# Patient Record
Sex: Male | Born: 1980 | Hispanic: Yes | Marital: Married | Smoking: Never smoker
Health system: Southern US, Community
[De-identification: ages and names within clinical notes are randomized; demographics above are authoritative.]

## PROBLEM LIST (undated history)

## (undated) DIAGNOSIS — N200 Calculus of kidney: Secondary | ICD-10-CM

---

## 2001-10-18 ENCOUNTER — Emergency Department (HOSPITAL_COMMUNITY): Admission: EM | Admit: 2001-10-18 | Discharge: 2001-10-18 | Payer: Self-pay

## 2012-04-03 ENCOUNTER — Encounter: Payer: Self-pay | Admitting: *Deleted

## 2013-12-10 ENCOUNTER — Ambulatory Visit (INDEPENDENT_AMBULATORY_CARE_PROVIDER_SITE_OTHER): Payer: BC Managed Care – PPO | Admitting: Physician Assistant

## 2013-12-10 VITALS — BP 110/80 | HR 92 | Temp 99.5°F | Resp 18 | Ht 65.5 in | Wt 150.0 lb

## 2013-12-10 DIAGNOSIS — J111 Influenza due to unidentified influenza virus with other respiratory manifestations: Secondary | ICD-10-CM

## 2013-12-10 DIAGNOSIS — J029 Acute pharyngitis, unspecified: Secondary | ICD-10-CM

## 2013-12-10 DIAGNOSIS — R509 Fever, unspecified: Secondary | ICD-10-CM

## 2013-12-10 DIAGNOSIS — R05 Cough: Secondary | ICD-10-CM

## 2013-12-10 DIAGNOSIS — J101 Influenza due to other identified influenza virus with other respiratory manifestations: Secondary | ICD-10-CM

## 2013-12-10 LAB — POCT CBC
Hemoglobin: 13.7 g/dL — AB (ref 14.1–18.1)
Lymph, poc: 1.3 (ref 0.6–3.4)
MCH, POC: 30 pg (ref 27–31.2)
MCHC: 32.2 g/dL (ref 31.8–35.4)
MID (cbc): 0.6 (ref 0–0.9)
MPV: 8 fL (ref 0–99.8)
POC Granulocyte: 4.1 (ref 2–6.9)
POC LYMPH PERCENT: 22.4 %L (ref 10–50)
POC MID %: 9.8 %M (ref 0–12)
Platelet Count, POC: 148 10*3/uL (ref 142–424)
RDW, POC: 12.8 %
WBC: 6 10*3/uL (ref 4.6–10.2)

## 2013-12-10 LAB — POCT INFLUENZA A/B: Influenza A, POC: POSITIVE

## 2013-12-10 MED ORDER — HYDROCODONE-HOMATROPINE 5-1.5 MG/5ML PO SYRP: ORAL_SOLUTION | ORAL | Status: DC

## 2013-12-10 MED ORDER — OSELTAMIVIR PHOSPHATE 75 MG PO CAPS: 75.0000 mg | ORAL_CAPSULE | Freq: Two times a day (BID) | ORAL | Status: DC

## 2013-12-10 NOTE — Progress Notes (Signed)
Patient ID: Jerry Booker MRN: [id number], DOB: 07-02-1981, 32 y.o. Date of Encounter: 12/10/2013, 5:20 PM  Primary Physician: No primary provider on file.  Chief Complaint: ST, congestion, cough, fever, chills, and myalgias x 4 days  HPI: 32 y.o. male with history below presents with 4 day history of ST, congestion, cough, fever, chills, and myalgias. T max 102 earlier this week. Symptoms began with a ST then progressed to the above. Cough has become productive over the past 24 hours. Cough is not worse any time of the day. No SOB or wheezing. He is sore from coughing. He has been taking ibuprofen 800 mg and Tylenol 1000 mg alternating around the clock to help with his aches. He did not receive an influenza vaccine this year. His wife has been sick with similar symptoms this past week as well.    Past Medical History  Diagnosis Date  . Chest pain      Home Meds: Prior to Admission medications   Medication Sig Start Date End Date Taking? Authorizing Provider  Multiple Vitamin (MULTIVITAMIN) tablet Take 1 tablet by mouth daily.   Yes Historical Provider, MD    Allergies: No Known Allergies  History   Social History  . Marital Status: Married    Spouse Name: N/A    Number of Children: N/A  . Years of Education: N/A   Occupational History  . Not on file.   Social History Main Topics  . Smoking status: Never Smoker   . Smokeless tobacco: Never Used  . Alcohol Use: Yes     Comment: rarely  . Drug Use: No  . Sexual Activity:    Other Topics Concern  . Not on file   Social History Narrative  . No narrative on file     Review of Systems: Constitutional: positive for chills, fever, fatigue, and myalgias  HEENT: see above Cardiovascular: negative for chest pain or palpitations Respiratory: positive for cough. negative for wheezing, or shortness of breath Abdominal: negative for abdominal pain, nausea, vomiting, or diarrhea Dermatological: negative for  rash Neurologic: positive for headache. negative for dizziness, or syncope   Physical Exam: Blood pressure 110/80, pulse 92, temperature 99.5 F (37.5 C), temperature source Oral, resp. rate 18, height 5' 5.5" (1.664 m), weight 150 lb (68.04 kg), SpO2 99.00%., Body mass index is 24.57 kg/(m^2). General: Well developed, well nourished, in no acute distress. Head: Normocephalic, atraumatic, eyes without discharge, sclera non-icteric, nares are congested. Bilateral auditory canals clear, TM's are without perforation, pearly grey and translucent with reflective cone of light bilaterally. Oral cavity moist, posterior pharynx erythematous with post nasal drip. No exudate or peritonsillar abscess. Uvula midline. Neck: Supple. No thyromegaly. Full ROM. Lymph nodes: less than 2 cm AC bilaterally. Lungs: Clear bilaterally to auscultation without wheezes, rales, or rhonchi. Breathing is unlabored. Heart: RRR with S1 S2. No murmurs, rubs, or gallops appreciated. Msk:  Strength and tone normal for age. Extremities/Skin: Warm and dry. No clubbing or cyanosis. No edema. No rashes or suspicious lesions. Neuro: Alert and oriented X 3. Moves all extremities spontaneously. Gait is normal. CNII-XII grossly in tact. Psych:  Responds to questions appropriately with a normal affect.   Labs: Results for orders placed in visit on 12/10/13  POCT CBC      Result Value Range   WBC 6.0  4.6 - 10.2 K/uL   Lymph, poc 1.3  0.6 - 3.4   POC LYMPH PERCENT 22.4  10 - 50 %L   MID (cbc) 0.6  0 - 0.9   POC MID % 9.8  0 - 12 %M   POC Granulocyte 4.1  2 - 6.9   Granulocyte percent 67.8  37 - 80 %G   RBC 4.56 (*) 4.69 - 6.13 M/uL   Hemoglobin 13.7 (*) 14.1 - 18.1 g/dL   HCT, POC 16.1 (*) 09.6 - 53.7 %   MCV 93.3  80 - 97 fL   MCH, POC 30.0  27 - 31.2 pg   MCHC 32.2  31.8 - 35.4 g/dL   RDW, POC 04.5     Platelet Count, POC 148  142 - 424 K/uL   MPV 8.0  0 - 99.8 fL  POCT INFLUENZA A/B      Result Value Range   Influenza  A, POC Positive     Influenza B, POC Negative    POCT RAPID STREP A (OFFICE)      Result Value Range   Rapid Strep A Screen Negative  Negative    Throat culture pending  ASSESSMENT AND PLAN:  32 y.o. male with influenza A, cough, fever, and myalgias  -Tamiflu 75 mg 1 po bid #10 no RF -Hycodan #4oz 1 tsp po q 4-6 hours prn cough no RF SED -Ibuprofen prn -Out of work until without symptoms for 48 hours, per CDC guidelines  -Rest/fluids -RTC precautions  Signed, Eula Listen, PA-C [facility] [place], [place] [address] [phone] 12/10/2013 5:20 PM

## 2013-12-12 LAB — CULTURE, GROUP A STREP: Organism ID, Bacteria: NORMAL

## 2014-07-13 ENCOUNTER — Ambulatory Visit (INDEPENDENT_AMBULATORY_CARE_PROVIDER_SITE_OTHER): Payer: BC Managed Care – PPO | Admitting: Physician Assistant

## 2014-07-13 VITALS — BP 108/78 | HR 68 | Temp 98.2°F | Resp 16 | Ht 65.5 in | Wt 152.6 lb

## 2014-07-13 DIAGNOSIS — L255 Unspecified contact dermatitis due to plants, except food: Secondary | ICD-10-CM

## 2014-07-13 DIAGNOSIS — L237 Allergic contact dermatitis due to plants, except food: Secondary | ICD-10-CM

## 2014-07-13 DIAGNOSIS — L282 Other prurigo: Secondary | ICD-10-CM

## 2014-07-13 MED ORDER — METHYLPREDNISOLONE SODIUM SUCC 125 MG IJ SOLR
125.0000 mg | Freq: Once | INTRAMUSCULAR | Status: AC
  Administered 2014-07-13: 125 mg via INTRAMUSCULAR

## 2014-07-13 MED ORDER — CLOBETASOL PROPIONATE 0.05 % EX CREA: 1.0000 "application " | TOPICAL_CREAM | Freq: Two times a day (BID) | CUTANEOUS | Status: DC

## 2014-07-13 MED ORDER — PREDNISONE 20 MG PO TABS: ORAL_TABLET | ORAL | Status: DC

## 2014-07-13 NOTE — Patient Instructions (Signed)
Start prednisone taper tomorrow morning.  May use clobetasol cream 2-3 times daily to affected areas on arms/legs/trunk Benadryl 25-50 mg at bedtime to help with itching Zyrtec (cetirizine) daily in the morning     Poison [facility] Poison ivy is a inflammation of the skin (contact dermatitis) caused by touching the allergens on the leaves of the ivy plant following previous exposure to the plant. The rash usually appears 48 hours after exposure. The rash is usually bumps (papules) or blisters (vesicles) in a linear pattern. Depending on your own sensitivity, the rash may simply cause redness and itching, or it may also progress to blisters which may break open. These must be well cared for to prevent secondary bacterial (germ) infection, followed by scarring. Keep any open areas dry, clean, dressed, and covered with an antibacterial ointment if needed. The eyes may also get puffy. The puffiness is worst in the morning and gets better as the day progresses. This dermatitis usually heals without scarring, within 2 to 3 weeks without treatment. HOME CARE INSTRUCTIONS  Thoroughly wash with soap and water as soon as you have been exposed to poison ivy. You have about one half hour to remove the plant resin before it will cause the rash. This washing will destroy the oil or antigen on the skin that is causing, or will cause, the rash. Be sure to wash under your fingernails as any plant resin there will continue to spread the rash. Do not rub skin vigorously when washing affected area. Poison ivy cannot spread if no oil from the plant remains on your body. A rash that has progressed to weeping sores will not spread the rash unless you have not washed thoroughly. It is also important to wash any clothes you have been wearing as these may carry active allergens. The rash will return if you wear the unwashed clothing, even several days later. Avoidance of the plant in the future is the best measure. Poison ivy plant can  be recognized by the number of leaves. Generally, poison ivy has three leaves with flowering branches on a single stem. Diphenhydramine may be purchased over the counter and used as needed for itching. Do not drive with this medication if it makes you drowsy.Ask your caregiver about medication for children. SEEK MEDICAL CARE IF:  Open sores develop.  Redness spreads beyond area of rash.  You notice purulent (pus-like) discharge.  You have increased pain.  Other signs of infection develop (such as fever). Document Released: 12/06/2000 Document Revised: 03/02/2012 Document Reviewed: 10/25/2009 [facility] Patient Information 2015 [place], [place]. This information is not intended to replace advice given to you by your health care provider. Make sure you discuss any questions you have with your health care provider.

## 2014-07-13 NOTE — Progress Notes (Signed)
   Subjective:    Patient ID: Jerry Booker, male    DOB: 11/15/1981, 33 y.o.   MRN: [id number]  HPI 33 year old male presents for evaluation of poison ivy. States he was working outside pulling weeds on 7/15 and was exposed. Symptoms did not start until 7/17. He has subsequently developed a pruritic rash on both arms, legs, trunk, and his face. It does seem to be spreading and is intensely pruritic. No OTC treatments tried. He is worried because it now is involving his face and eyelids.  Patient is otherwise doing well with no other concerns today.     Review of Systems  Constitutional: Negative for fever and chills.  Eyes: Negative for pain and visual disturbance.  Respiratory: Negative for shortness of breath.   Gastrointestinal: Negative for nausea and vomiting.  Skin: Positive for rash.       Objective:   Physical Exam  Constitutional: He is oriented to person, place, and time. He appears well-developed and well-nourished.  HENT:  Head: Normocephalic and atraumatic.  Right Ear: External ear normal.  Eyes: Conjunctivae are normal.  Bilateral eyelids slightly swollen and erythematous. Cheeks and nose have erythematous rash  Neck: Normal range of motion.  Cardiovascular: Normal rate, regular rhythm and normal heart sounds.   Pulmonary/Chest: Effort normal.  Neurological: He is alert and oriented to person, place, and time.  Skin:     Noted areas have a papulovesiclar rash in a linear distribution. Right arm +weeping serous fluid. No purulence, warmth, or induration.   Psychiatric: He has a normal mood and affect. His behavior is normal. Judgment and thought content normal.          Assessment & Plan:  Poison ivy - Plan: methylPREDNISolone sodium succinate (SOLU-MEDROL) 125 mg/2 mL injection 125 mg, predniSONE (DELTASONE) 20 MG tablet, clobetasol cream (TEMOVATE) 0.05 %  Pruritic rash - Plan: methylPREDNISolone sodium succinate (SOLU-MEDROL) 125 mg/2 mL injection 125 mg,  predniSONE (DELTASONE) 20 MG tablet  Due to facial involvement, will treat aggressively with Solumedrol 125 mg IM today Start prednisone taper tomorrow Clobetasol cream 2-3 times daily to arms/legs/trunk Recommend benadryl 25-50 mg at bedtime, zyrtec daily in the morning RTC precautions discussed. F/u if symptoms worsening or fail to improve.

## 2014-10-19 ENCOUNTER — Ambulatory Visit (INDEPENDENT_AMBULATORY_CARE_PROVIDER_SITE_OTHER): Payer: BC Managed Care – PPO | Admitting: Family Medicine

## 2014-10-19 VITALS — BP 104/70 | HR 73 | Temp 98.3°F | Resp 16 | Ht 65.75 in | Wt 149.0 lb

## 2014-10-19 DIAGNOSIS — Z23 Encounter for immunization: Secondary | ICD-10-CM

## 2014-10-19 DIAGNOSIS — Z131 Encounter for screening for diabetes mellitus: Secondary | ICD-10-CM

## 2014-10-19 DIAGNOSIS — M25562 Pain in left knee: Secondary | ICD-10-CM

## 2014-10-19 DIAGNOSIS — J358 Other chronic diseases of tonsils and adenoids: Secondary | ICD-10-CM

## 2014-10-19 DIAGNOSIS — Z13220 Encounter for screening for lipoid disorders: Secondary | ICD-10-CM

## 2014-10-19 DIAGNOSIS — J029 Acute pharyngitis, unspecified: Secondary | ICD-10-CM

## 2014-10-19 DIAGNOSIS — Z0000 Encounter for general adult medical examination without abnormal findings: Secondary | ICD-10-CM

## 2014-10-19 NOTE — Patient Instructions (Addendum)
You can try over the counter Claritin and Zantac to treat allergies and possible heartburn cause of sore throat. If neither one of these re helping over the next few weeks, let me know and I will refer you to an Ear, Nose and Throat specialist. I do not see anything of concern on your throat exam today.  For your knee pain, try some of the exercises below, Tylenol as needed and if not improving return to discuss further and possible X-rays.  Return to the clinic or go to the nearest emergency room if any of your symptoms worsen or new symptoms occur.  You should receive a call or letter about your lab results within the next week to 10 days.     Keeping you healthy  Get these tests  Blood pressure- Have your blood pressure checked once a year by your healthcare provider.  Normal blood pressure is 120/80.  Weight- Have your body mass index (BMI) calculated to screen for obesity.  BMI is a measure of body fat based on height and weight. You can also calculate your own BMI at https://www.west-esparza.com/www.nhlbisupport.com/bmi/.  Cholesterol- Have your cholesterol checked regularly starting at age 34, sooner may be necessary if you have diabetes, high blood pressure, if a family member developed heart diseases at an early age or if you smoke.   Chlamydia, HIV, and other sexual transmitted disease- Get screened each year until the age of 34 then within three months of each new sexual partner.  Diabetes- Have your blood sugar checked regularly if you have high blood pressure, high cholesterol, a family history of diabetes or if you are overweight.  Get these vaccines  Flu shot- Every fall.  Tetanus shot- Every 10 years.  Menactra- Single dose; prevents meningitis.  Take these steps  Don't smoke- If you do smoke, ask your healthcare provider about quitting. For tips on how to quit, go to www.smokefree.gov or call 1-800-QUIT-NOW.  Be physically active- Exercise 5 days a week for at least 30 minutes.  If you are  not already physically active start slow and gradually work up to 30 minutes of moderate physical activity.  Examples of moderate activity include walking briskly, mowing the yard, dancing, swimming bicycling, etc.  Eat a healthy diet- Eat a variety of healthy foods such as fruits, vegetables, low fat milk, low fat cheese, yogurt, lean meats, poultry, fish, beans, tofu, etc.  For more information on healthy eating, go to www.thenutritionsource.org  Drink alcohol in moderation- Limit alcohol intake two drinks or less a day.  Never drink and drive.  Dentist- Brush and floss teeth twice daily; visit your dentis twice a year.  Depression-Your emotional health is as important as your physical health.  If you're feeling down, losing interest in things you normally enjoy please talk with your healthcare provider.  Gun Safety- If you keep a gun in your home, keep it unloaded and with the safety lock on.  Bullets should be stored separately.  Helmet use- Always wear a helmet when riding a motorcycle, bicycle, rollerblading or skateboarding.  Safe sex- If you may be exposed to a sexually transmitted infection, use a condom  Seat belts- Seat bels can save your life; always wear one.  Smoke/Carbon Monoxide detectors- These detectors need to be installed on the appropriate level of your home.  Replace batteries at least once a year.  Skin Cancer- When out in the sun, cover up and use sunscreen SPF 15 or higher.  Violence- If anyone is threatening  or hurting you, please tell your healthcare provider.  Knee Exercises EXERCISES RANGE OF MOTION (ROM) AND STRETCHING EXERCISES These exercises may help you when beginning to rehabilitate your injury. Your symptoms may resolve with or without further involvement from your physician, physical therapist, or athletic trainer. While completing these exercises, remember:   Restoring tissue flexibility helps normal motion to return to the joints. This allows  healthier, less painful movement and activity.  An effective stretch should be held for at least 30 seconds.  A stretch should never be painful. You should only feel a gentle lengthening or release in the stretched tissue. STRETCH - Knee Extension, Prone  Lie on your stomach on a firm surface, such as a bed or countertop. Place your right / left knee and leg just beyond the edge of the surface. You may wish to place a towel under the far end of your right / left thigh for comfort.  Relax your leg muscles and allow gravity to straighten your knee. Your clinician may advise you to add an ankle weight if more resistance is helpful for you.  You should feel a stretch in the back of your right / left knee. Hold this position for __________ seconds. Repeat __________ times. Complete this stretch __________ times per day. * Your physician, physical therapist, or athletic trainer may ask you to add ankle weight to enhance your stretch.  RANGE OF MOTION - Knee Flexion, Active  Lie on your back with both knees straight. (If this causes back discomfort, bend your opposite knee, placing your foot flat on the floor.)  Slowly slide your heel back toward your buttocks until you feel a gentle stretch in the front of your knee or thigh.  Hold for __________ seconds. Slowly slide your heel back to the starting position. Repeat __________ times. Complete this exercise __________ times per day.  STRETCH - Quadriceps, Prone   Lie on your stomach on a firm surface, such as a bed or padded floor.  Bend your right / left knee and grasp your ankle. If you are unable to reach your ankle or pant leg, use a belt around your foot to lengthen your reach.  Gently pull your heel toward your buttocks. Your knee should not slide out to the side. You should feel a stretch in the front of your thigh and/or knee.  Hold this position for __________ seconds. Repeat __________ times. Complete this stretch __________ times  per day.  STRETCH - Hamstrings, Supine   Lie on your back. Loop a belt or towel over the ball of your right / left foot.  Straighten your right / left knee and slowly pull on the belt to raise your leg. Do not allow the right / left knee to bend. Keep your opposite leg flat on the floor.  Raise the leg until you feel a gentle stretch behind your right / left knee or thigh. Hold this position for __________ seconds. Repeat __________ times. Complete this stretch __________ times per day.  STRENGTHENING EXERCISES These exercises may help you when beginning to rehabilitate your injury. They may resolve your symptoms with or without further involvement from your physician, physical therapist, or athletic trainer. While completing these exercises, remember:   Muscles can gain both the endurance and the strength needed for everyday activities through controlled exercises.  Complete these exercises as instructed by your physician, physical therapist, or athletic trainer. Progress the resistance and repetitions only as guided.  You may experience muscle soreness or fatigue,  but the pain or discomfort you are trying to eliminate should never worsen during these exercises. If this pain does worsen, stop and make certain you are following the directions exactly. If the pain is still present after adjustments, discontinue the exercise until you can discuss the trouble with your clinician. STRENGTH - Quadriceps, Isometrics  Lie on your back with your right / left leg extended and your opposite knee bent.  Gradually tense the muscles in the front of your right / left thigh. You should see either your knee cap slide up toward your hip or increased dimpling just above the knee. This motion will push the back of the knee down toward the floor/mat/bed on which you are lying.  Hold the muscle as tight as you can without increasing your pain for __________ seconds.  Relax the muscles slowly and completely in  between each repetition. Repeat __________ times. Complete this exercise __________ times per day.  STRENGTH - Quadriceps, Short Arcs   Lie on your back. Place a __________ inch towel roll under your knee so that the knee slightly bends.  Raise only your lower leg by tightening the muscles in the front of your thigh. Do not allow your thigh to rise.  Hold this position for __________ seconds. Repeat __________ times. Complete this exercise __________ times per day.  OPTIONAL ANKLE WEIGHTS: Begin with ____________________, but DO NOT exceed ____________________. Increase in 1 pound/0.5 kilogram increments.  STRENGTH - Quadriceps, Straight Leg Raises  Quality counts! Watch for signs that the quadriceps muscle is working to insure you are strengthening the correct muscles and not "cheating" by substituting with healthier muscles.  Lay on your back with your right / left leg extended and your opposite knee bent.  Tense the muscles in the front of your right / left thigh. You should see either your knee cap slide up or increased dimpling just above the knee. Your thigh may even quiver.  Tighten these muscles even more and raise your leg 4 to 6 inches off the floor. Hold for __________ seconds.  Keeping these muscles tense, lower your leg.  Relax the muscles slowly and completely in between each repetition. Repeat __________ times. Complete this exercise __________ times per day.  STRENGTH - Hamstring, Curls  Lay on your stomach with your legs extended. (If you lay on a bed, your feet may hang over the edge.)  Tighten the muscles in the back of your thigh to bend your right / left knee up to 90 degrees. Keep your hips flat on the bed/floor.  Hold this position for __________ seconds.  Slowly lower your leg back to the starting position. Repeat __________ times. Complete this exercise __________ times per day.  OPTIONAL ANKLE WEIGHTS: Begin with ____________________, but DO NOT exceed  ____________________. Increase in 1 pound/0.5 kilogram increments.  STRENGTH - Quadriceps, Squats  Stand in a door frame so that your feet and knees are in line with the frame.  Use your hands for balance, not support, on the frame.  Slowly lower your weight, bending at the hips and knees. Keep your lower legs upright so that they are parallel with the door frame. Squat only within the range that does not increase your knee pain. Never let your hips drop below your knees.  Slowly return upright, pushing with your legs, not pulling with your hands. Repeat __________ times. Complete this exercise __________ times per day.  STRENGTH - Quadriceps, Wall Slides  Follow guidelines for form closely. Increased knee pain  often results from poorly placed feet or knees.  Lean against a smooth wall or door and walk your feet out 18-24 inches. Place your feet hip-width apart.  Slowly slide down the wall or door until your knees bend __________ degrees.* Keep your knees over your heels, not your toes, and in line with your hips, not falling to either side.  Hold for __________ seconds. Stand up to rest for __________ seconds in between each repetition. Repeat __________ times. Complete this exercise __________ times per day. * Your physician, physical therapist, or athletic trainer will alter this angle based on your symptoms and progress. Document Released: 10/23/2005 Document Revised: 04/25/2014 Document Reviewed: 03/23/2009 [facility] Patient Information 2015 [place], [place]. This information is not intended to replace advice given to you by your health care provider. Make sure you discuss any questions you have with your health care provider.

## 2014-10-19 NOTE — Progress Notes (Addendum)
Subjective:   Patient ID: Jerry Booker, male    DOB: 11/14/1981, 33 y.o.   MRN: [id number] This chart was scribed for Jerry StaggersJeffrey Ural Acree, MD by Jerry Booker, ED Scribe. The patient was seen in Room 4 The patient's care was started at 2:41 PM.   10/19/2014  Chief Complaint  Patient presents with  . Annual Exam    HPI HPI Comments: Jerry Booker is a 33 y.o. male who presents to the [facility] here for complete physical exam. Pt notes he does not have any specific health concerns today but he would like to test his blood sugar and cholesterol. He is fasting today for his blood work. Pt also notes he has a white bump on his right tonsil with occasional pain and pain with swallowing. Pt notes the bump stinks. He notes he has been pushing the bump with a Q-tip with no relief. Pt denies rhinorrhea, abdominal pain or cough at this time.   Pt also complains of constant, gradually worsening, mild left knee pain onset two weeks ago. 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 Pt denies any prior surgery to the left knee. Pt denies any mechanical symptoms. Pt denies taking medications to relieve his pain.   Health Maintenance 1.) Immunizations:   A. Tetanus: Pt denies his last tetanus shot was greater than 10 years ago, but is unable to specify the exact date. He believes it has been about 8-9 years.   B. Flu Shot: Pt denies recently receiving  the flu shot. Pt had the flu last year and was sick for about two weeks. Pt would like to receive the flu vaccine today.  2.) Exercise: Pt notes he typically exercises 2x/week for about 20 minutes.  3.) Dentist: Pt notes he went to the dentist within the last 6 months and goes annually. 4.) Eye Care Provider: Pt denies seeing an ophthalmologist regularly but does not he saw an ophthalmologist with normal findings last year due to extensive sun exposure.   5.) STI Testing: Pt is  married with no partners outside the marriage. Pt has previously had STI testing prior to getting married including HIV.  Pt denies alcohol usage and denies smoking.    There are no active problems to display for this patient.  Past Medical History  Diagnosis Date  . Chest pain    History reviewed. No pertinent past surgical history. No Known Allergies Prior to Admission medications   Medication Sig Start Date End Date Taking? Authorizing Provider  clobetasol cream (TEMOVATE) 0.05 % Apply 1 application topically 2 (two) times daily. 07/13/14   Jerry Jaquita RectorM Marte, PA-C  diphenhydrAMINE (BENADRYL) 25 MG tablet Take 25 mg by mouth once. Pt took 2 pills last night 07/12/14 07/13/14  Historical Provider, MD  predniSONE (DELTASONE) 20 MG tablet Take 3 PO QAM x3days, 2 PO QAM x3days, 1 PO QAM x3days 07/13/14   Jerry NayHeather M Marte, PA-C   History   Social History  . Marital Status: Married    Spouse Name: N/A    Number of Children: N/A  . Years of Education: N/A   Occupational History  . Not on file.   Social History Main Topics  . Smoking status: Never Smoker   . Smokeless tobacco: Never Used  . Alcohol Use: Yes     Comment: rarely  . Drug Use: No  . Sexual Activity:    Other Topics Concern  .  Not on file   Social History Narrative  . No narrative on file    Review of Systems  Constitutional: Negative for fatigue and unexpected weight change.  HENT: Positive for sore throat. Negative for rhinorrhea.   Eyes: Negative for visual disturbance.  Respiratory: Negative for cough, chest tightness and shortness of breath.   Cardiovascular: Negative for chest pain, palpitations and leg swelling.  Gastrointestinal: Negative for abdominal pain and blood in stool.  Musculoskeletal: Positive for arthralgias.  Neurological: Negative for dizziness, light-headedness and headaches.  All other systems reviewed and are negative.   Objective:  Physical Exam  Nursing note and vitals  reviewed. Constitutional: He is oriented to person, place, and time. He appears well-developed and well-nourished.  HENT:  Head: Normocephalic and atraumatic.  Right Ear: External ear normal.  Left Ear: External ear normal.  Mouth/Throat: Oropharynx is clear and moist.  Posterior oropharynx at the apex of his right tonsillar recess. Very small, white adherent tonsolith, no exudate or hypertrophy.  Eyes: Conjunctivae and EOM are normal. Pupils are equal, round, and reactive to light.  Neck: Normal range of motion. Neck supple. No JVD present. Carotid bruit is not present. No thyromegaly present.  No lymphadenopathy.   Cardiovascular: Normal rate, regular rhythm, normal heart sounds and intact distal pulses.   No murmur heard. Pulmonary/Chest: Effort normal and breath sounds normal. No respiratory distress. He has no wheezes. He has no rales.  Abdominal: Soft. He exhibits no distension. There is no tenderness. Hernia confirmed negative in the right inguinal area and confirmed negative in the left inguinal area.  Musculoskeletal: Normal range of motion. He exhibits no edema and no tenderness.  Right knee: FROM with no effusion. Left knee: FROM, no effusion, skin intact. Negative for McMurray's, Lachman's, Valgus and Varus tests. Negative anterior and posterior drawer.    Lymphadenopathy:    He has no cervical adenopathy.  Neurological: He is alert and oriented to person, place, and time. He has normal reflexes.  Skin: Skin is warm and dry.  Psychiatric: He has a normal mood and affect. His behavior is normal.   Filed Vitals:   10/19/14 1357  BP: 104/70  Pulse: 73  Temp: 98.3 F (36.8 C)  TempSrc: Oral  Resp: 16  Height: 5' 5.75" (1.67 m)  Weight: 149 lb (67.586 kg)  SpO2: 97%   Assessment & Plan:  303 PM- Patient informed of current plan for treatment and evaluation and agrees with plan at this time. Jerry Booker is a 33 y.o. male Annual physical exam  --anticipatory guidance  as below in AVS, screening labs above. Health maintenance items as above in HPI discussed/recommended as applicable.   Screening cholesterol level - Plan: Comprehensive metabolic panel  Diabetes mellitus screening - Plan: Lipid panel  Flu vaccine need - flu vaccine given.   Left knee pain - intermittent, but longstanding by hx, NKI. Reassuring exam. Discussed XR - declined today. Trial of HEP, otc tylenol if needed - rtc for further eval if persists.   Tonsillith, Sore throat  -suspect the white substance he has seen on tonsil is tonsillith and no concerning findings on exam. Discussed benign nature of these. Discussed his concerns after what he had researched on internet, and all questions answered. Discussed some common causes of intermittent sore throat including viruses, strep, allergic, or reflux/LPR. He has had heartburn in past, but denies this recently and denies other allergic type sx's.  Offered ENT eval, but he chose to try otc claritin and zantac,  and if sx's persist next few weeks - can call and I will refer him to ENT for eval. Understanding expressed.   No orders of the defined types were placed in this encounter.   Patient Instructions  You can try over the counter Claritin and Zantac to treat allergies and possible heartburn cause of sore throat. If neither one of these re helping over the next few weeks, let me know and I will refer you to an Ear, Nose and Throat specialist. I do not see anything of concern on your throat exam today.  For your knee pain, try some of the exercises below, Tylenol as needed and if not improving return to discuss further and possible X-rays.  Return to the clinic or go to the nearest emergency room if any of your symptoms worsen or new symptoms occur.  You should receive a call or letter about your lab results within the next week to 10 days.     Keeping you healthy  Get these tests  Blood pressure- Have your blood pressure checked once a  year by your healthcare provider.  Normal blood pressure is 120/80.  Weight- Have your body mass index (BMI) calculated to screen for obesity.  BMI is a measure of body fat based on height and weight. You can also calculate your own BMI at https://www.west-esparza.com/.  Cholesterol- Have your cholesterol checked regularly starting at age 72, sooner may be necessary if you have diabetes, high blood pressure, if a family member developed heart diseases at an early age or if you smoke.   Chlamydia, HIV, and other sexual transmitted disease- Get screened each year until the age of 69 then within three months of each new sexual partner.  Diabetes- Have your blood sugar checked regularly if you have high blood pressure, high cholesterol, a family history of diabetes or if you are overweight.  Get these vaccines  Flu shot- Every fall.  Tetanus shot- Every 10 years.  Menactra- Single dose; prevents meningitis.  Take these steps  Don't smoke- If you do smoke, ask your healthcare provider about quitting. For tips on how to quit, go to www.smokefree.gov or call 1-800-QUIT-NOW.  Be physically active- Exercise 5 days a week for at least 30 minutes.  If you are not already physically active start slow and gradually work up to 30 minutes of moderate physical activity.  Examples of moderate activity include walking briskly, mowing the yard, dancing, swimming bicycling, etc.  Eat a healthy diet- Eat a variety of healthy foods such as fruits, vegetables, low fat milk, low fat cheese, yogurt, lean meats, poultry, fish, beans, tofu, etc.  For more information on healthy eating, go to www.thenutritionsource.org  Drink alcohol in moderation- Limit alcohol intake two drinks or less a day.  Never drink and drive.  Dentist- Brush and floss teeth twice daily; visit your dentis twice a year.  Depression-Your emotional health is as important as your physical health.  If you're feeling down, losing interest in things  you normally enjoy please talk with your healthcare provider.  Gun Safety- If you keep a gun in your home, keep it unloaded and with the safety lock on.  Bullets should be stored separately.  Helmet use- Always wear a helmet when riding a motorcycle, bicycle, rollerblading or skateboarding.  Safe sex- If you may be exposed to a sexually transmitted infection, use a condom  Seat belts- Seat bels can save your life; always wear one.  Smoke/Carbon Monoxide detectors- These detectors need to  be installed on the appropriate level of your home.  Replace batteries at least once a year.  Skin Cancer- When out in the sun, cover up and use sunscreen SPF 15 or higher.  Violence- If anyone is threatening or hurting you, please tell your healthcare provider.  Knee Exercises EXERCISES RANGE OF MOTION (ROM) AND STRETCHING EXERCISES These exercises may help you when beginning to rehabilitate your injury. Your symptoms may resolve with or without further involvement from your physician, physical therapist, or athletic trainer. While completing these exercises, remember:   Restoring tissue flexibility helps normal motion to return to the joints. This allows healthier, less painful movement and activity.  An effective stretch should be held for at least 30 seconds.  A stretch should never be painful. You should only feel a gentle lengthening or release in the stretched tissue. STRETCH - Knee Extension, Prone  Lie on your stomach on a firm surface, such as a bed or countertop. Place your right / left knee and leg just beyond the edge of the surface. You may wish to place a towel under the far end of your right / left thigh for comfort.  Relax your leg muscles and allow gravity to straighten your knee. Your clinician may advise you to add an ankle weight if more resistance is helpful for you.  You should feel a stretch in the back of your right / left knee. Hold this position for __________  seconds. Repeat __________ times. Complete this stretch __________ times per day. * Your physician, physical therapist, or athletic trainer may ask you to add ankle weight to enhance your stretch.  RANGE OF MOTION - Knee Flexion, Active  Lie on your back with both knees straight. (If this causes back discomfort, bend your opposite knee, placing your foot flat on the floor.)  Slowly slide your heel back toward your buttocks until you feel a gentle stretch in the front of your knee or thigh.  Hold for __________ seconds. Slowly slide your heel back to the starting position. Repeat __________ times. Complete this exercise __________ times per day.  STRETCH - Quadriceps, Prone   Lie on your stomach on a firm surface, such as a bed or padded floor.  Bend your right / left knee and grasp your ankle. If you are unable to reach your ankle or pant leg, use a belt around your foot to lengthen your reach.  Gently pull your heel toward your buttocks. Your knee should not slide out to the side. You should feel a stretch in the front of your thigh and/or knee.  Hold this position for __________ seconds. Repeat __________ times. Complete this stretch __________ times per day.  STRETCH - Hamstrings, Supine   Lie on your back. Loop a belt or towel over the ball of your right / left foot.  Straighten your right / left knee and slowly pull on the belt to raise your leg. Do not allow the right / left knee to bend. Keep your opposite leg flat on the floor.  Raise the leg until you feel a gentle stretch behind your right / left knee or thigh. Hold this position for __________ seconds. Repeat __________ times. Complete this stretch __________ times per day.  STRENGTHENING EXERCISES These exercises may help you when beginning to rehabilitate your injury. They may resolve your symptoms with or without further involvement from your physician, physical therapist, or athletic trainer. While completing these  exercises, remember:   Muscles can gain both the endurance  and the strength needed for everyday activities through controlled exercises.  Complete these exercises as instructed by your physician, physical therapist, or athletic trainer. Progress the resistance and repetitions only as guided.  You may experience muscle soreness or fatigue, but the pain or discomfort you are trying to eliminate should never worsen during these exercises. If this pain does worsen, stop and make certain you are following the directions exactly. If the pain is still present after adjustments, discontinue the exercise until you can discuss the trouble with your clinician. STRENGTH - Quadriceps, Isometrics  Lie on your back with your right / left leg extended and your opposite knee bent.  Gradually tense the muscles in the front of your right / left thigh. You should see either your knee cap slide up toward your hip or increased dimpling just above the knee. This motion will push the back of the knee down toward the floor/mat/bed on which you are lying.  Hold the muscle as tight as you can without increasing your pain for __________ seconds.  Relax the muscles slowly and completely in between each repetition. Repeat __________ times. Complete this exercise __________ times per day.  STRENGTH - Quadriceps, Short Arcs   Lie on your back. Place a __________ inch towel roll under your knee so that the knee slightly bends.  Raise only your lower leg by tightening the muscles in the front of your thigh. Do not allow your thigh to rise.  Hold this position for __________ seconds. Repeat __________ times. Complete this exercise __________ times per day.  OPTIONAL ANKLE WEIGHTS: Begin with ____________________, but DO NOT exceed ____________________. Increase in 1 pound/0.5 kilogram increments.  STRENGTH - Quadriceps, Straight Leg Raises  Quality counts! Watch for signs that the quadriceps muscle is working to insure you  are strengthening the correct muscles and not "cheating" by substituting with healthier muscles.  Lay on your back with your right / left leg extended and your opposite knee bent.  Tense the muscles in the front of your right / left thigh. You should see either your knee cap slide up or increased dimpling just above the knee. Your thigh may even quiver.  Tighten these muscles even more and raise your leg 4 to 6 inches off the floor. Hold for __________ seconds.  Keeping these muscles tense, lower your leg.  Relax the muscles slowly and completely in between each repetition. Repeat __________ times. Complete this exercise __________ times per day.  STRENGTH - Hamstring, Curls  Lay on your stomach with your legs extended. (If you lay on a bed, your feet may hang over the edge.)  Tighten the muscles in the back of your thigh to bend your right / left knee up to 90 degrees. Keep your hips flat on the bed/floor.  Hold this position for __________ seconds.  Slowly lower your leg back to the starting position. Repeat __________ times. Complete this exercise __________ times per day.  OPTIONAL ANKLE WEIGHTS: Begin with ____________________, but DO NOT exceed ____________________. Increase in 1 pound/0.5 kilogram increments.  STRENGTH - Quadriceps, Squats  Stand in a door frame so that your feet and knees are in line with the frame.  Use your hands for balance, not support, on the frame.  Slowly lower your weight, bending at the hips and knees. Keep your lower legs upright so that they are parallel with the door frame. Squat only within the range that does not increase your knee pain. Never let your hips drop below your  knees.  Slowly return upright, pushing with your legs, not pulling with your hands. Repeat __________ times. Complete this exercise __________ times per day.  STRENGTH - Quadriceps, Wall Slides  Follow guidelines for form closely. Increased knee pain often results from poorly  placed feet or knees.  Lean against a smooth wall or door and walk your feet out 18-24 inches. Place your feet hip-width apart.  Slowly slide down the wall or door until your knees bend __________ degrees.* Keep your knees over your heels, not your toes, and in line with your hips, not falling to either side.  Hold for __________ seconds. Stand up to rest for __________ seconds in between each repetition. Repeat __________ times. Complete this exercise __________ times per day. * Your physician, physical therapist, or athletic trainer will alter this angle based on your symptoms and progress. Document Released: 10/23/2005 Document Revised: 04/25/2014 Document Reviewed: 03/23/2009 [facility] Patient Information 2015 [employer] [place] [place]. This information is not intended to replace advice given to you by your health care provider. Make sure you discuss any questions you have with your health care provider.       I personally performed the services described in this documentation, which was scribed in my presence. The recorded information has been reviewed and considered, and addended by me as needed.

## 2014-10-20 LAB — COMPREHENSIVE METABOLIC PANEL
ALBUMIN: 5.3 g/dL — AB (ref 3.5–5.2)
ALK PHOS: 86 U/L (ref 39–117)
ALT: 43 U/L (ref 0–53)
AST: 31 U/L (ref 0–37)
BUN: 10 mg/dL (ref 6–23)
CO2: 27 mEq/L (ref 19–32)
CREATININE: 0.81 mg/dL (ref 0.50–1.35)
Calcium: 9.9 mg/dL (ref 8.4–10.5)
Chloride: 102 mEq/L (ref 96–112)
GLUCOSE: 86 mg/dL (ref 70–99)
Potassium: 4.3 mEq/L (ref 3.5–5.3)
Sodium: 141 mEq/L (ref 135–145)
Total Bilirubin: 0.7 mg/dL (ref 0.2–1.2)
Total Protein: 7.7 g/dL (ref 6.0–8.3)

## 2014-10-20 LAB — LIPID PANEL
CHOL/HDL RATIO: 3.7 ratio
Cholesterol: 182 mg/dL (ref 0–200)
HDL: 49 mg/dL (ref >39)
LDL CALC: 104 mg/dL — AB (ref 0–99)
TRIGLYCERIDES: 147 mg/dL (ref <150)
VLDL: 29 mg/dL (ref 0–40)

## 2019-03-20 ENCOUNTER — Other Ambulatory Visit: Payer: Self-pay

## 2019-03-20 ENCOUNTER — Ambulatory Visit (HOSPITAL_COMMUNITY)
Admission: EM | Admit: 2019-03-20 | Discharge: 2019-03-20 | Disposition: A | Discharge status: Home / Self Care | Payer: Self-pay | Attending: Family Medicine | Admitting: Family Medicine

## 2019-03-20 ENCOUNTER — Encounter (HOSPITAL_COMMUNITY): Payer: Self-pay

## 2019-03-20 DIAGNOSIS — J111 Influenza due to unidentified influenza virus with other respiratory manifestations: Secondary | ICD-10-CM

## 2019-03-20 DIAGNOSIS — R69 Illness, unspecified: Secondary | ICD-10-CM

## 2019-03-20 NOTE — ED Notes (Signed)
Patient verbalizes understanding of discharge instructions. Opportunity for questioning and answers were provided. Patient discharged from [facility] by MD. 

## 2019-03-20 NOTE — ED Triage Notes (Signed)
Patient presents to Urgent Care with complaints of fever and productive cough since yesterday. Patient states highest recorded temp was 102.8 at home, pt's temp 103.1 during triage, HR 121. pt has been taking 500mg  of tylenol every 4 hours at home for his fever (last took at 0500) and robitussin every 6 hours for his cough.

## 2019-03-20 NOTE — ED Provider Notes (Signed)
[facility]   [id number] 03/20/19 Arrival Time: 1017  ASSESSMENT & PLAN:  1. Influenza-like illness    Written information regarding COVID-19 given. Recommend self-quarantine. Discussed.  Discussed typical duration of symptoms. OTC symptom care as needed. Ensure adequate fluid intake and rest. May f/u with PCP or here as needed.  Reviewed expectations re: course of current medical issues. Questions answered. Outlined signs and symptoms indicating need for more acute intervention. Patient verbalized understanding. After Visit Summary given.   SUBJECTIVE: History from: patient.  Jerry Booker is a 38 y.o. male who presents with complaint of nasal congestion, post-nasal drainage, and a persistent dry cough; without sore throat. Onset abrupt, yesterday; with fatigue and with mild body aches. SOB: none. Wheezing: none. Fever: yes, 201.8 degrees F at home this morning; Tylenol brings down to around 100 degrees F. Overall normal PO intake without n/v. Known sick contacts: no. No specific or significant aggravating or alleviating factors reported.   Social History   Tobacco Use  Smoking Status Never Smoker  Smokeless Tobacco Never Used    ROS: As per HPI.   OBJECTIVE:  Vitals:   03/20/19 1043  BP: 122/80  Pulse: (!) 118  Resp: (!) 22  Temp: (!) 103.1 F (39.5 C)  TempSrc: Oral  SpO2: 97%    Recheck RR: 18  General appearance: alert; appears fatigued; no distress HEENT: nasal congestion; clear runny nose Neck: supple without LAD CV: slight tachycardia Lungs: unlabored respirations; able to speak full sentences without distress or pausing to catch his breath; cough: mild and dry Ext: no LE edema Skin: warm and dry Neuro: normal gaint Psychological: alert and cooperative; normal mood and affect   No Known Allergies  PMH: No lung disease.  Social History   Socioeconomic History  . Marital status: Married    Spouse name: Not on file  . Number  of children: Not on file  . Years of education: Not on file  . Highest education level: Not on file  Occupational History  . Not on file  Social Needs  . Financial resource strain: Not on file  . Food insecurity:    Worry: Not on file    Inability: Not on file  . Transportation needs:    Medical: Not on file    Non-medical: Not on file  Tobacco Use  . Smoking status: Never Smoker  . Smokeless tobacco: Never Used  Substance and Sexual Activity  . Alcohol use: Yes    Comment: beer once in a while  . Drug use: Not on file  . Sexual activity: Not on file  Lifestyle  . Physical activity:    Days per week: Not on file    Minutes per session: Not on file  . Stress: Not on file  Relationships  . Social connections:    Talks on phone: Not on file    Gets together: Not on file    Attends religious service: Not on file    Active member of club or organization: Not on file    Attends meetings of clubs or organizations: Not on file    Relationship status: Not on file  . Intimate partner violence:    Fear of current or ex partner: Not on file    Emotionally abused: Not on file    Physically abused: Not on file    Forced sexual activity: Not on file  Other Topics Concern  . Not on file  Social History Narrative  . Not on file  Mardella Layman, MD 03/20/19 1110

## 2019-06-15 ENCOUNTER — Other Ambulatory Visit: Payer: Self-pay

## 2019-06-15 DIAGNOSIS — Z20822 Contact with and (suspected) exposure to covid-19: Secondary | ICD-10-CM

## 2019-06-17 LAB — NOVEL CORONAVIRUS, NAA: SARS-CoV-2, NAA: NOT DETECTED

## 2020-07-16 ENCOUNTER — Encounter (HOSPITAL_COMMUNITY): Payer: Self-pay | Admitting: Emergency Medicine

## 2020-07-16 ENCOUNTER — Other Ambulatory Visit: Payer: Self-pay

## 2020-07-16 ENCOUNTER — Emergency Department (HOSPITAL_COMMUNITY)
Admission: EM | Admit: 2020-07-16 | Discharge: 2020-07-17 | Disposition: A | Discharge status: Home / Self Care | Payer: Self-pay | Attending: Emergency Medicine | Admitting: Emergency Medicine

## 2020-07-16 DIAGNOSIS — Z5321 Procedure and treatment not carried out due to patient leaving prior to being seen by health care provider: Secondary | ICD-10-CM | POA: Insufficient documentation

## 2020-07-16 DIAGNOSIS — M545 Low back pain: Secondary | ICD-10-CM | POA: Insufficient documentation

## 2020-07-16 NOTE — ED Triage Notes (Signed)
Pt c/o sudden onset lower back pain, denies injury/urinary symptoms or blood in urine. Pt ambulatory.

## 2020-07-17 ENCOUNTER — Other Ambulatory Visit: Payer: Self-pay

## 2020-07-17 ENCOUNTER — Ambulatory Visit (HOSPITAL_COMMUNITY)
Admission: EM | Admit: 2020-07-17 | Discharge: 2020-07-17 | Disposition: A | Discharge status: Home / Self Care | Payer: Self-pay

## 2020-07-17 ENCOUNTER — Encounter (HOSPITAL_COMMUNITY): Payer: Self-pay | Admitting: Emergency Medicine

## 2020-07-17 DIAGNOSIS — N200 Calculus of kidney: Secondary | ICD-10-CM | POA: Insufficient documentation

## 2020-07-17 DIAGNOSIS — M549 Dorsalgia, unspecified: Secondary | ICD-10-CM

## 2020-07-17 LAB — CBC
HCT: 42.9 % (ref 39.0–52.0)
Hemoglobin: 15 g/dL (ref 13.0–17.0)
MCH: 30.5 pg (ref 26.0–34.0)
MCHC: 35 g/dL (ref 30.0–36.0)
MCV: 87.2 fL (ref 80.0–100.0)
Platelets: 217 10*3/uL (ref 150–400)
RBC: 4.92 MIL/uL (ref 4.22–5.81)
RDW: 11.3 % — ABNORMAL LOW (ref 11.5–15.5)
WBC: 10.7 10*3/uL — ABNORMAL HIGH (ref 4.0–10.5)
nRBC: 0 % (ref 0.0–0.2)

## 2020-07-17 LAB — POCT URINALYSIS DIP (DEVICE)
Bilirubin Urine: NEGATIVE
Glucose, UA: NEGATIVE mg/dL
Ketones, ur: NEGATIVE mg/dL
Leukocytes,Ua: NEGATIVE
Nitrite: NEGATIVE
Protein, ur: 30 mg/dL — AB
Specific Gravity, Urine: 1.025 (ref 1.005–1.030)
Urobilinogen, UA: 0.2 mg/dL (ref 0.0–1.0)
pH: 6.5 (ref 5.0–8.0)

## 2020-07-17 LAB — BASIC METABOLIC PANEL
Anion gap: 10 (ref 5–15)
BUN: 12 mg/dL (ref 6–20)
CO2: 27 mmol/L (ref 22–32)
Calcium: 9.5 mg/dL (ref 8.9–10.3)
Chloride: 103 mmol/L (ref 98–111)
Creatinine, Ser: 0.85 mg/dL (ref 0.61–1.24)
GFR calc Af Amer: >60 mL/min (ref >60)
GFR calc non Af Amer: >60 mL/min (ref >60)
Glucose, Bld: 98 mg/dL (ref 70–99)
Potassium: 3.6 mmol/L (ref 3.5–5.1)
Sodium: 140 mmol/L (ref 135–145)

## 2020-07-17 MED ORDER — TAMSULOSIN HCL 0.4 MG PO CAPS: 0.4000 mg | ORAL_CAPSULE | Freq: Every day | ORAL | 0 refills | Status: DC

## 2020-07-17 MED ORDER — ACETAMINOPHEN 500 MG PO TABS: 1000.0000 mg | ORAL_TABLET | Freq: Four times a day (QID) | ORAL | 0 refills | Status: AC | PRN

## 2020-07-17 NOTE — ED Triage Notes (Signed)
Back pain last night was severe.  Patient describes left flank pain.  Patient felt the need to urinate, but unable to urinate.  During the night, patient started urinating blood.   Denies any history of this .

## 2020-07-17 NOTE — Discharge Instructions (Addendum)
We have sent some basic labs, if we need to discuss the findings, you will receive a phone call  I believe you have passed a Kidney stone  Take the medicine I have prescribed and urinate through the screen  Schedule follow up with the internal medicine center to establish care  If pain returns and is severe, you have fever, vomiting, return or if after hours go to the Emergency Department

## 2020-07-17 NOTE — ED Provider Notes (Signed)
[facility]    CSN: [id number] Arrival date & time: 07/17/20  1206      History   Chief Complaint Chief Complaint  Patient presents with  . Back Pain    HPI Jerry Booker is a 39 y.o. male.   Patient reports for evaluation of 1 day history of back and flank pain.  He also reports blood in his urine.  He reports this started suddenly last night after eating dinner.  He reports left-sided back and flank pain.  He reports following this he had a sudden urge to urinate but could not.  Eventually he was passing urine with blood in it.  This prompted him to go to the emergency department first with [facility], where he had basic labs drawn but did not stay to be seen.  He reports he fell asleep in the waiting room and awoke feeling better after about 5 hours.  The pain had mostly subsided.  He reports here today feeling much better than yesterday with minimal to no pain in his back.  He reports his urine is cleared up.  He reports he is making more urine and going about 5 or 6 times today.  No painful urination.  Denies any fevers or chills.  He did endorse some nausea last night but no vomiting.  No nausea or vomiting today.     Past Medical History:  Diagnosis Date  . Chest pain     There are no problems to display for this patient.   History reviewed. No pertinent surgical history.     Home Medications    Prior to Admission medications   Medication Sig Start Date End Date Taking? Authorizing Provider  ibuprofen (ADVIL) 200 MG tablet Take 200 mg by mouth every 6 (six) hours as needed.   Yes [provider]  acetaminophen (TYLENOL) 500 MG tablet Take 2 tablets (1,000 mg total) by mouth every 6 (six) hours as needed. 07/17/20   Mariaguadalupe Fialkowski, Veryl Speak, PA-C  tamsulosin (FLOMAX) 0.4 MG CAPS capsule Take 1 capsule (0.4 mg total) by mouth daily. 07/17/20   Berline Semrad, Veryl Speak, PA-C    Family History Family History  Problem Relation Age of Onset  . Mental illness  Mother     Social History Social History   Tobacco Use  . Smoking status: Never Smoker  . Smokeless tobacco: Never Used  Vaping Use  . Vaping Use: Never used  Substance Use Topics  . Alcohol use: Yes    Comment: beer once in a while  . Drug use: No     Allergies   Patient has no known allergies.   Review of Systems Review of Systems   Physical Exam Triage Vital Signs ED Triage Vitals  Enc Vitals Group     BP 07/17/20 1336 125/80     Pulse Rate 07/17/20 1336 77     Resp 07/17/20 1336 18     Temp 07/17/20 1336 98.4 F (36.9 C)     Temp Source 07/17/20 1336 Oral     SpO2 07/17/20 1336 100 %     Weight --      Height --      Head Circumference --      Peak Flow --      Pain Score 07/17/20 1334 0     Pain Loc --      Pain Edu? --      Excl. in GC? --    No data found.  Updated  Vital Signs BP 125/80 (BP Location: Right Arm)   Pulse 77   Temp 98.4 F (36.9 C) (Oral)   Resp 18   SpO2 100%   Visual Acuity Right Eye Distance:   Left Eye Distance:   Bilateral Distance:    Right Eye Near:   Left Eye Near:    Bilateral Near:     Physical Exam Vitals and nursing note reviewed.  Constitutional:      General: He is not in acute distress.    Appearance: He is well-developed. He is not ill-appearing.  HENT:     Head: Normocephalic and atraumatic.  Eyes:     Conjunctiva/sclera: Conjunctivae normal.  Cardiovascular:     Rate and Rhythm: Normal rate and regular rhythm.     Heart sounds: No murmur heard.   Pulmonary:     Effort: Pulmonary effort is normal. No respiratory distress.     Breath sounds: Normal breath sounds.  Abdominal:     Palpations: Abdomen is soft.     Tenderness: There is no abdominal tenderness. There is no right CVA tenderness or left CVA tenderness.  Musculoskeletal:     Cervical back: Neck supple.  Skin:    General: Skin is warm and dry.  Neurological:     Mental Status: He is alert.      UC Treatments / Results   Labs (all labs ordered are listed, but only abnormal results are displayed) Labs Reviewed  CBC - Abnormal; Notable for the following components:      Result Value   WBC 10.7 (*)    RDW 11.3 (*)    All other components within normal limits  POCT URINALYSIS DIP (DEVICE) - Abnormal; Notable for the following components:   Hgb urine dipstick MODERATE (*)    Protein, ur 30 (*)    All other components within normal limits  URINE CULTURE  BASIC METABOLIC PANEL    EKG   Radiology No results found.  Procedures Procedures (including critical care time)  Medications Ordered in UC Medications - No data to display  Initial Impression / Assessment and Plan / UC Course  I have reviewed the triage vital signs and the nursing notes.  Pertinent labs & imaging results that were available during my care of the patient were reviewed by me and considered in my medical decision making (see chart for details).     #Kidney stone Patient is a 39 year old who presents with most like to be a passed kidney stone.  Clinically he is significantly improved.  Patient does wish to have some basic labs drawn to ensure he is doing better.  Per chart review patient had a white blood cell count of 14 at [facility], improved to 10.7 today.  Urine today with only blood, no leukocytes or nitrites, will send culture.  He is afebrile, doubt infection.  Overall he significantly improved.  We will send him out with Flomax and a urine strainer with instructions to follow-up and establish with the internal medicine center.  Strict return emergency department precautions were discussed.  Patient verbalized understanding plan of care. Final Clinical Impressions(s) / UC Diagnoses   Final diagnoses:  Kidney stone     Discharge Instructions     We have sent some basic labs, if we need to discuss the findings, you will receive a phone call  I believe you have passed a Kidney stone  Take the medicine I have  prescribed and urinate through the screen  Schedule follow  up with the internal medicine center to establish care  If pain returns and is severe, you have fever, vomiting, return or if after hours go to the Emergency Department      ED Prescriptions    Medication Sig Dispense Auth. Provider   tamsulosin (FLOMAX) 0.4 MG CAPS capsule Take 1 capsule (0.4 mg total) by mouth daily. 30 capsule Madelina Sanda, Veryl Speak, PA-C   acetaminophen (TYLENOL) 500 MG tablet Take 2 tablets (1,000 mg total) by mouth every 6 (six) hours as needed. 30 tablet Soledad Budreau, Veryl Speak, PA-C     PDMP not reviewed this encounter.   Hermelinda Medicus, PA-C 07/17/20 2212

## 2020-07-18 LAB — URINE CULTURE: Culture: NO GROWTH

## 2020-07-19 ENCOUNTER — Ambulatory Visit: Payer: Self-pay | Admitting: Internal Medicine

## 2020-07-19 ENCOUNTER — Encounter: Payer: Self-pay | Admitting: Internal Medicine

## 2020-07-19 ENCOUNTER — Other Ambulatory Visit: Payer: Self-pay

## 2020-07-19 ENCOUNTER — Ambulatory Visit (HOSPITAL_COMMUNITY)
Admission: RE | Admit: 2020-07-19 | Discharge: 2020-07-19 | Disposition: A | Discharge status: Home / Self Care | Payer: Self-pay | Source: Ambulatory Visit | Attending: Student in an Organized Health Care Education/Training Program | Admitting: Student in an Organized Health Care Education/Training Program

## 2020-07-19 VITALS — BP 129/87 | HR 74 | Temp 98.4°F | Ht 68.0 in | Wt 163.9 lb

## 2020-07-19 DIAGNOSIS — R1032 Left lower quadrant pain: Secondary | ICD-10-CM

## 2020-07-19 DIAGNOSIS — N200 Calculus of kidney: Secondary | ICD-10-CM

## 2020-07-19 MED ORDER — KETOROLAC TROMETHAMINE 30 MG/ML IJ SOLN
30.0000 mg | Freq: Once | INTRAMUSCULAR | Status: AC
  Administered 2020-07-19: 30 mg via INTRAMUSCULAR

## 2020-07-19 MED ORDER — OXYCODONE HCL 5 MG PO CAPS: 5.0000 mg | ORAL_CAPSULE | ORAL | 0 refills | Status: AC | PRN

## 2020-07-19 NOTE — Patient Instructions (Addendum)
Mr. Rodas,  It was a pleasure seeing you in clinic. Today we discussed:   Kidney stone: I am prescribing oxycodone 5mg  every 4 hours as needed for pain. I am also ordering a CT study to evaluate for kidney stones and need for  Further intervention. I will give you a call with results.   If you have any questions or concerns, please call our clinic at [phone] between 9am-5pm and after hours call [phone] and ask for the internal medicine resident on call. If you feel you are having a medical emergency please call 911.   Thank you, we look forward to helping you remain healthy!

## 2020-07-19 NOTE — Assessment & Plan Note (Signed)
Mr. Jerry Booker is presenting with four days of intermittent left sided flank pain radiating to suprapubic abdomen. He endorses one episode of hematuria and nonbloody nonbilious emesis on Sunday. He was evaluated for this in the ED on 7/26 and suspected to have passed a renal stone. He was given tamsulosin. He notes intermittent improvement in symptoms since then. However, this morning, patient started experiencing similar left flank pain with radiation to the suprapubic region. He also endorses feeling of incomplete bladder emptying. He denies any further episodes of hematuria, no fevers/chills or nausea/vomiting.  On examination, patient appears in distress secondary to pain and unable to find position of comfort. He has tenderness to palpation of left flank. However, no suprapubic tenderness or abdominal tenderness noted. POCUS without urinary retention; mildly dilated collecting duct of left kidney without obvious obstructing stone noted. Patient given toradol for pain control in clinic and referred for immediate CT Renal stone study which was significant for 68mm renal stone at ureterovesicular junction.  Patient instructed for symptomatic management with pain control at this time. Return precautions provided.  Plan: Continue tamsulosin 0.4mg  daily Oxycodone IR 5mg  q4h prn

## 2020-07-19 NOTE — Progress Notes (Signed)
° °  CC: left flank pain   HPI:  Mr.Jerry Booker is a 39 y.o. male without any significant past medical history presenting for evaluation of intermittent left flank pain for four days duration. He was evaluated for this in the ED on 7/26 and discharged with tamsulosin; however, pain has persisted. Please see problem based charting for complete assessment and plan.  Past Medical History:  Diagnosis Date   Chest pain    Review of Systems:  Negative except as stated in HPI.  Physical Exam:  Vitals:   07/19/20 1440  BP: (!) 129/87  Pulse: 74  Temp: 98.4 F (36.9 C)  TempSrc: Oral  SpO2: 98%  Weight: 163 lb 14.4 oz (74.3 kg)  Height: 5\' 8"  (1.727 m)   Physical Exam  Constitutional: Appears well-developed and well-nourished. Appears to be in acute distress secondary to pain.  Cardiovascular: Normal rate, regular rhythm, S1 and S2 present, no murmurs, rubs, gallops.  Distal pulses intact Respiratory: No respiratory distress, no accessory muscle use.  Effort is normal.  Lungs are clear to auscultation bilaterally. GI: Nondistended, soft, nontender to palpation, normal active bowel sounds; mild tenderness to palpation of left flank Neurological: Is alert and oriented x4, no apparent focal deficits noted. Skin: Warm and dry.  No rash, erythema, lesions noted. POCUS: No bladder distension noted; Right kidney nl. Left kidney without hydronephrosis but does have mildly dilated collecting duct. No obstructing stone noted on ultrasound.   Assessment & Plan:   See Encounters Tab for problem based charting.  Patient seen with Dr. 

## 2020-07-20 NOTE — Progress Notes (Signed)
Internal Medicine Clinic Attending  I saw and evaluated the patient.  I personally confirmed the key portions of the history and exam documented by Dr. Mcarthur Rossetti and I reviewed pertinent patient test results.  The assessment, diagnosis, and plan were formulated together and I agree with the documentation in the resident's note.   51mm left UVJ stone with mild left hydroureteronephrosis causing symptoms of pain and nausea. Good chance this stone will pass on its own, planning for supportive care through the weekend. Call back if no better by Monday and can refer to urology if needed.

## 2021-08-02 IMAGING — CT CT RENAL STONE PROTOCOL
2 of 4 series · 16 of 46 positions shown, 18 images · non-contrast
Comparison: None.

CLINICAL DATA: 39-year-old male with flank pain. Concern for kidney
stone.

EXAM:
CT ABDOMEN AND PELVIS WITHOUT CONTRAST
TECHNIQUE: Multidetector CT imaging of the abdomen and pelvis was performed
following the standard protocol without IV contrast.

[Series 3: stone study 5.0 i30f 2 · axial · 0.72mm/px · z∈[+802,+1237]mm · 13 of 95 slices shown, 15 images]
[im 4/95  soft-tissue]
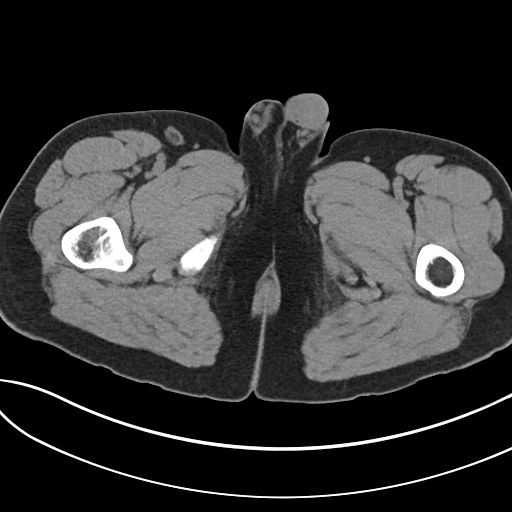
[im 4/95  bone]
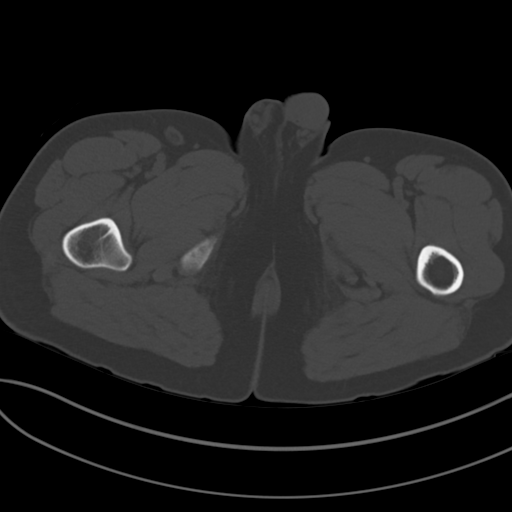
[im 12/95  soft-tissue]
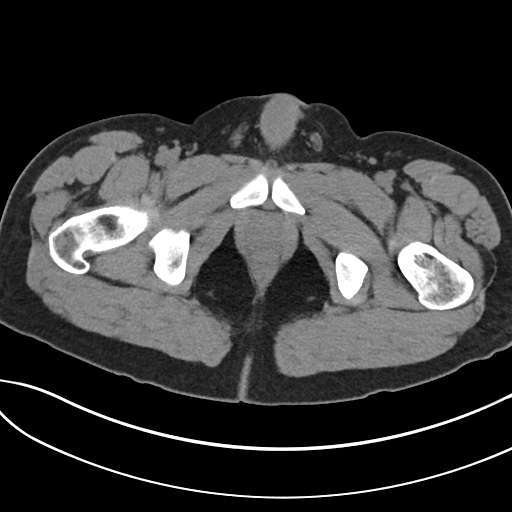
[im 20/95  soft-tissue]
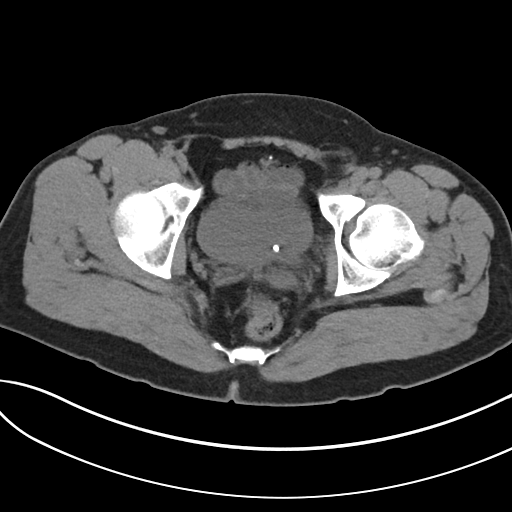
[im 28/95  soft-tissue]
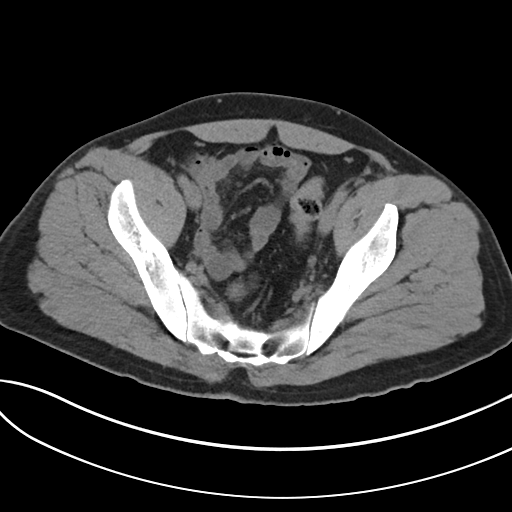
[im 32/95  soft-tissue]
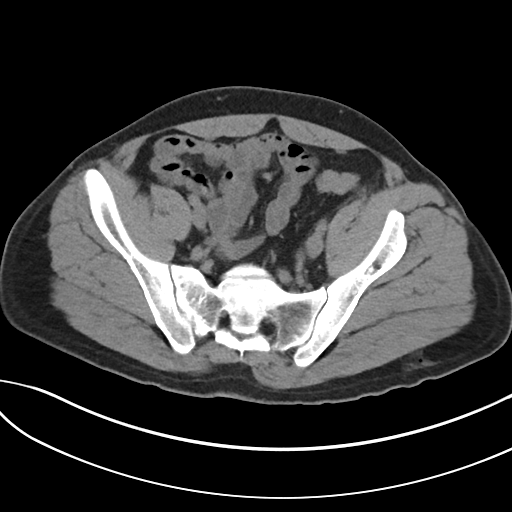
[im 40/95  soft-tissue]
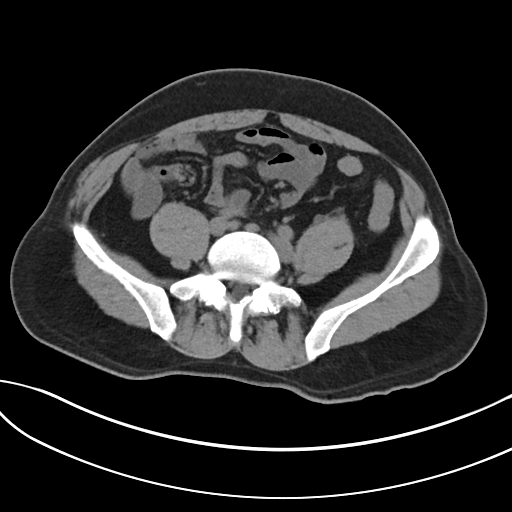
[im 48/95  soft-tissue]
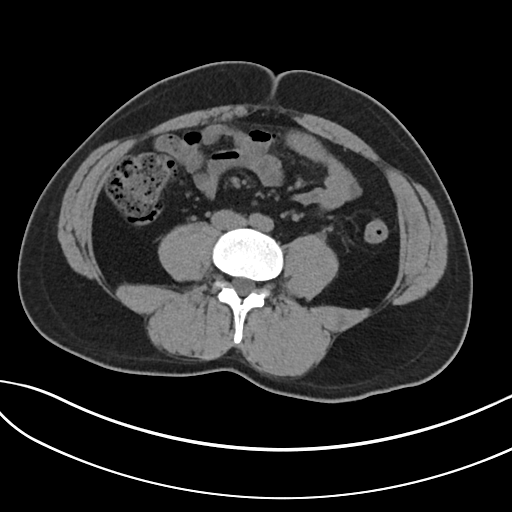
[im 55/95  soft-tissue]
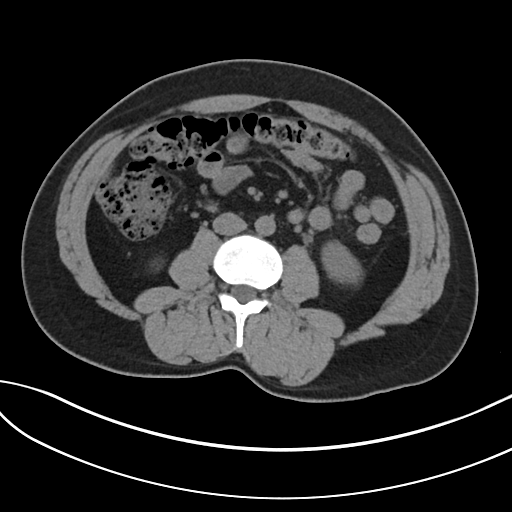
[im 63/95  soft-tissue]
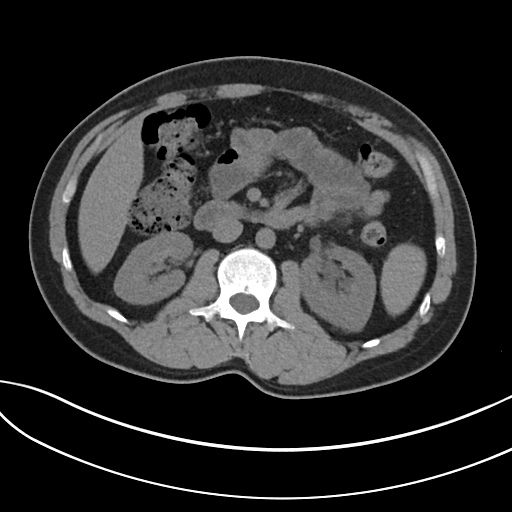
[im 63/95  bone]
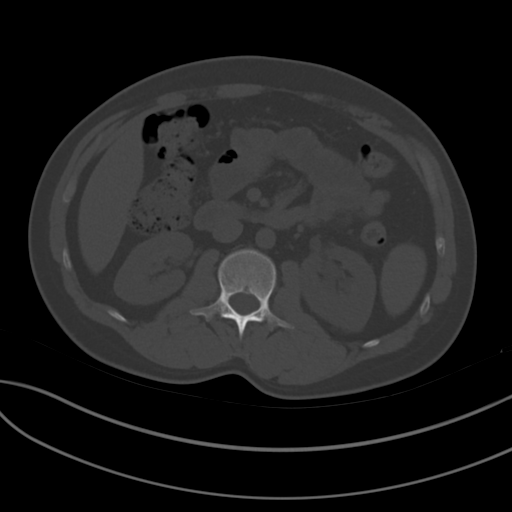
[im 67/95  soft-tissue]
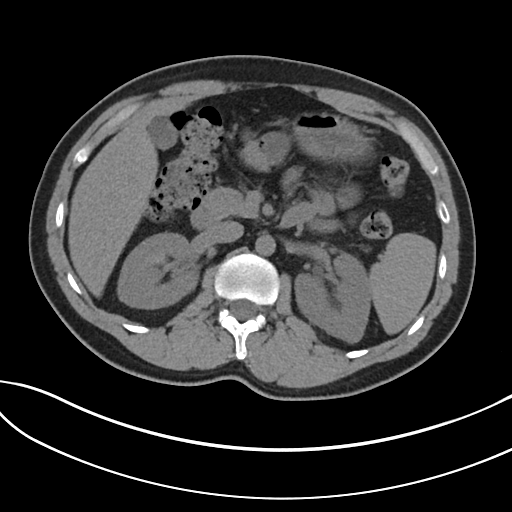
[im 75/95  soft-tissue]
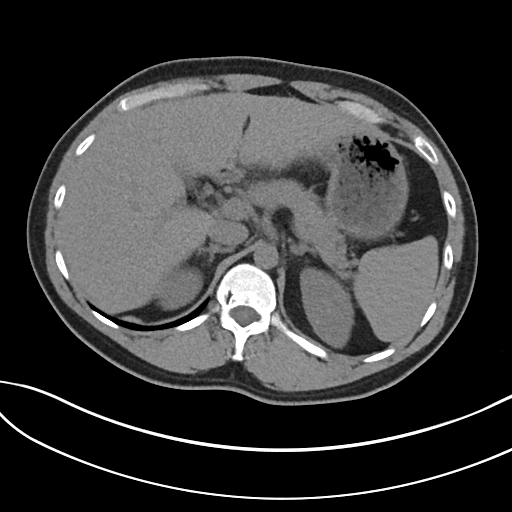
[im 83/95  soft-tissue]
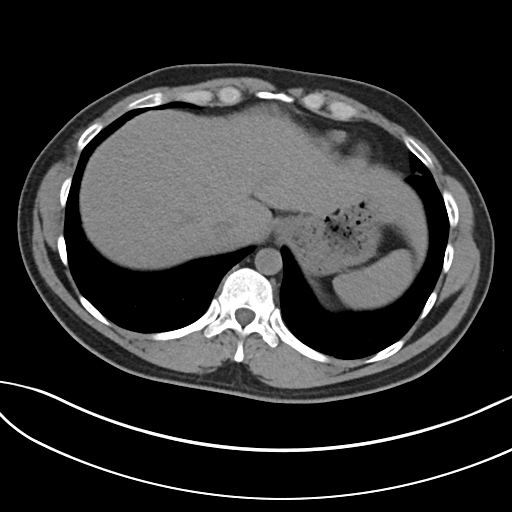
[im 91/95  soft-tissue]
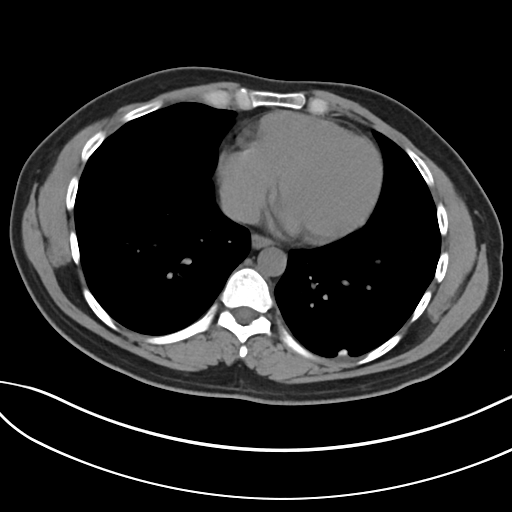

[Series 6: coronal soft tissue · coronal · 0.92mm/px · 3 of 104 slices shown]
[im 35/104  soft-tissue]
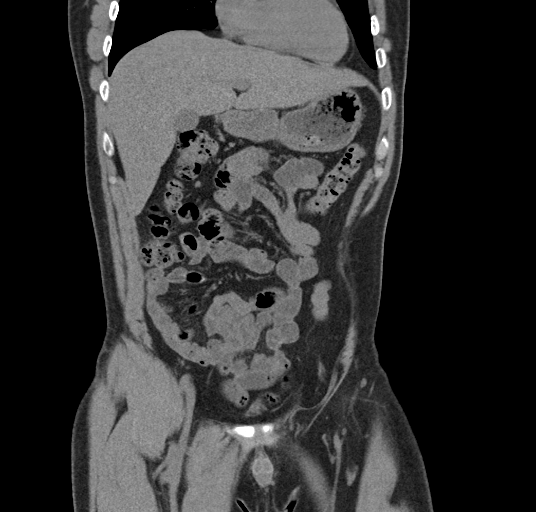
[im 46/104  soft-tissue]
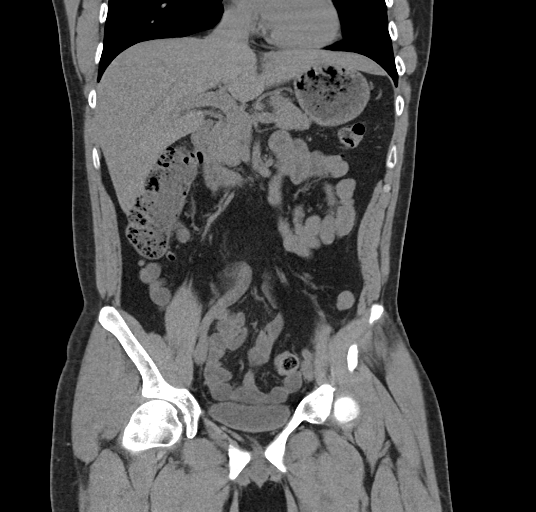
[im 58/104  soft-tissue]
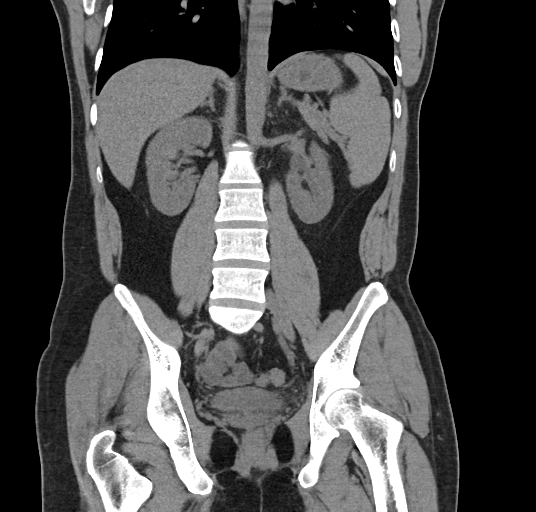

[16 of 46 positions shown; findings below may reference images not displayed]

FINDINGS: Evaluation of this exam is limited in the absence of intravenous
contrast.

Lower chest: The visualized lung bases are clear.

No intra-abdominal free air or free fluid.

Hepatobiliary: There is diffuse fatty infiltration of the liver. No
intrahepatic biliary ductal dilatation. The gallbladder is
unremarkable.

Pancreas: Unremarkable. No pancreatic ductal dilatation or
surrounding inflammatory changes.

Spleen: Normal in size without focal abnormality.

Adrenals/Urinary Tract: The adrenal glands unremarkable. There is a
nonobstructing 3 mm stone in the interpolar right kidney. There is
no hydronephrosis on the right. The right ureter is unremarkable.
There is a 5 mm calculus in the bladder adjacent to the left
ureterovesical junction which may represent a left UVJ stone versus
a recently passed left renal calculus. There is mild left
hydronephrosis. No stone identified in the left kidney or left
ureter.

Stomach/Bowel: There is no bowel obstruction or active inflammation.
The appendix is normal.

Vascular/Lymphatic: The abdominal aorta and IVC are unremarkable. No
portal venous gas. There is no adenopathy.

Reproductive: The prostate and seminal vesicles are grossly
unremarkable. No pelvic mass.

Other: Small fat containing left inguinal hernia. No fluid
collection.

Musculoskeletal: No acute or significant osseous findings.
IMPRESSION: 1. A 5 mm recently passed left renal calculus versus a left UVJ
stone with mild left hydronephrosis.
2. A 3 mm nonobstructing right renal interpolar stone. No
hydronephrosis on the right.
3. Fatty liver.
4. No bowel obstruction. Normal appendix.

## 2022-04-10 ENCOUNTER — Other Ambulatory Visit: Payer: Self-pay

## 2022-04-10 ENCOUNTER — Encounter (HOSPITAL_COMMUNITY): Payer: Self-pay

## 2022-04-10 ENCOUNTER — Encounter (HOSPITAL_COMMUNITY): Payer: Self-pay | Admitting: Emergency Medicine

## 2022-04-10 ENCOUNTER — Emergency Department (HOSPITAL_COMMUNITY)
Admission: EM | Admit: 2022-04-10 | Discharge: 2022-04-11 | Disposition: A | Discharge status: Home / Self Care | Payer: Self-pay | Attending: Emergency Medicine | Admitting: Emergency Medicine

## 2022-04-10 ENCOUNTER — Ambulatory Visit (HOSPITAL_COMMUNITY)
Admission: EM | Admit: 2022-04-10 | Discharge: 2022-04-10 | Disposition: A | Discharge status: Home / Self Care | Payer: Self-pay | Attending: Emergency Medicine | Admitting: Emergency Medicine

## 2022-04-10 DIAGNOSIS — N200 Calculus of kidney: Secondary | ICD-10-CM | POA: Insufficient documentation

## 2022-04-10 DIAGNOSIS — N3001 Acute cystitis with hematuria: Secondary | ICD-10-CM

## 2022-04-10 LAB — CBC
HCT: 42.6 % (ref 39.0–52.0)
Hemoglobin: 15.2 g/dL (ref 13.0–17.0)
MCH: 30.6 pg (ref 26.0–34.0)
MCHC: 35.7 g/dL (ref 30.0–36.0)
MCV: 85.9 fL (ref 80.0–100.0)
Platelets: 217 10*3/uL (ref 150–400)
RBC: 4.96 MIL/uL (ref 4.22–5.81)
RDW: 11.2 % — ABNORMAL LOW (ref 11.5–15.5)
WBC: 13.8 10*3/uL — ABNORMAL HIGH (ref 4.0–10.5)
nRBC: 0 % (ref 0.0–0.2)

## 2022-04-10 LAB — URINALYSIS, ROUTINE W REFLEX MICROSCOPIC
Bacteria, UA: NONE SEEN
Bilirubin Urine: NEGATIVE
Glucose, UA: NEGATIVE mg/dL
Ketones, ur: 20 mg/dL — AB
Leukocytes,Ua: NEGATIVE
Nitrite: NEGATIVE
Protein, ur: 30 mg/dL — AB
Specific Gravity, Urine: 1.023 (ref 1.005–1.030)
pH: 7 (ref 5.0–8.0)

## 2022-04-10 LAB — POCT URINALYSIS DIPSTICK, ED / UC
Glucose, UA: 250 mg/dL — AB
Ketones, ur: 15 mg/dL — AB
Nitrite: POSITIVE — AB
Protein, ur: >=300 mg/dL — AB
Specific Gravity, Urine: 1.015 (ref 1.005–1.030)
Urobilinogen, UA: 2 mg/dL — ABNORMAL HIGH (ref 0.0–1.0)
pH: 5.5 (ref 5.0–8.0)

## 2022-04-10 LAB — BASIC METABOLIC PANEL
Anion gap: 11 (ref 5–15)
BUN: 19 mg/dL (ref 6–20)
CO2: 26 mmol/L (ref 22–32)
Calcium: 9.9 mg/dL (ref 8.9–10.3)
Chloride: 101 mmol/L (ref 98–111)
Creatinine, Ser: 1.14 mg/dL (ref 0.61–1.24)
GFR, Estimated: >60 mL/min (ref >60)
Glucose, Bld: 134 mg/dL — ABNORMAL HIGH (ref 70–99)
Potassium: 4.1 mmol/L (ref 3.5–5.1)
Sodium: 138 mmol/L (ref 135–145)

## 2022-04-10 MED ORDER — IBUPROFEN 400 MG PO TABS: 600.0000 mg | ORAL_TABLET | Freq: Once | ORAL | Status: DC

## 2022-04-10 MED ORDER — NITROFURANTOIN MONOHYD MACRO 100 MG PO CAPS: 100.0000 mg | ORAL_CAPSULE | Freq: Two times a day (BID) | ORAL | 0 refills | Status: DC

## 2022-04-10 MED ORDER — OXYCODONE HCL 5 MG PO CAPS: 5.0000 mg | ORAL_CAPSULE | ORAL | 0 refills | Status: AC | PRN

## 2022-04-10 MED ORDER — IBUPROFEN 400 MG PO TABS
600.0000 mg | ORAL_TABLET | Freq: Once | ORAL | Status: AC
  Administered 2022-04-10: 600 mg via ORAL
  Filled 2022-04-10: qty 1

## 2022-04-10 MED ORDER — TAMSULOSIN HCL 0.4 MG PO CAPS: 0.4000 mg | ORAL_CAPSULE | Freq: Every day | ORAL | 0 refills | Status: DC

## 2022-04-10 NOTE — ED Triage Notes (Signed)
Pt c/o increased RLQ pain that radiates to his back. States he was seen at [facility] today, had blood in his urine and treated for UTI. C/o difficulty urinating.  ?

## 2022-04-10 NOTE — ED Triage Notes (Signed)
3 days ago Pt had some hematuria that seemed to resolve with increased water in take. Today, Pt reports an onset of right sided flank pain, hematuria and urinary frequency sensation. No meds taken. Pt notes h/o kidney stones. ?

## 2022-04-10 NOTE — Discharge Instructions (Addendum)
Today you are being treated for kidney stone and for infection ? ?Take Macrobid twice daily for 5 days ? ?Take Flomax daily for 14 days, this medicine is to help relax the bladder allowing the stone with the ease pass with ease ? ?Use oxycodone every 4 hours as needed for severe pain ? ?Please follow-up with your primary doctor for further evaluation ? ?At any point if your symptoms worsen in severity you will need to go to the nearest emergency department for imaging and evaluation for an obstruction ?

## 2022-04-10 NOTE — ED Provider Triage Note (Signed)
Emergency Medicine Provider Triage Evaluation Note ? ?Jerry Booker , a 41 y.o. male  was evaluated in triage.  Pt complains of right-sided flank pain, chills, nausea and vomiting.  Says it started this morning.  History of kidney stones and says this feels the same.  Episodes of hematuria this morning.  Denies dysuria ? ?Review of Systems  ?Positive: Hematuria, flank pain ?Negative: Dysuria ? ?Physical Exam  ?BP (!) 117/98 (BP Location: Left Arm)   Pulse 93   Temp 98.1 ?F (36.7 ?C) (Oral)   Resp (!) 22   SpO2 94%  ?Gen:   Awake, no distress   ?Resp:  Normal effort  ?MSK:   Moves extremities without difficulty  ?Other:  Negative CVA bilaterally however right flank/lower quadrant tenderness ? ?Medical Decision Making  ?Medically screening exam initiated at 9:20 PM.  Appropriate orders placed.  Jerry Booker was informed that the remainder of the evaluation will be completed by another provider, this initial triage assessment does not replace that evaluation, and the importance of remaining in the ED until their evaluation is complete. ? ?Suspect nephrolithiasis, lab work and CT renal ordered ?  ?Jerry Benders, PA-C ?04/10/22 2121 ? ?

## 2022-04-10 NOTE — ED Provider Notes (Signed)
?[facility] ? ? ? ?CSN: [id number] ?Arrival date & time: 04/10/22  1355 ? ? ?  ? ?History   ?Chief Complaint ?Chief Complaint  ?Patient presents with  ? Flank Pain  ?  right  ? ? ?HPI ?Jerry Booker is a 41 y.o. male.  ? ?Patient presents with right flank pain, hematuria, dysuria and urinary frequency beginning today.  Endorses that 3 days ago he noted hematuria once in the morning but did not occur again until today.  Has not attempted treatment of symptoms.  History of kidney stones.  Denies fever, chills, nausea, vomiting, diarrhea, urinary urgency, lower abdominal pain or pressure, penile discharge, penile or testicle swelling. ? ?Past Medical History:  ?Diagnosis Date  ? Chest pain   ? ? ?Patient Active Problem List  ? Diagnosis Date Noted  ? Nephrolithiasis 07/19/2020  ? ? ?History reviewed. No pertinent surgical history. ? ? ? ? ?Home Medications   ? ?Prior to Admission medications   ?Medication Sig Start Date End Date Taking? Authorizing Provider  ?acetaminophen (TYLENOL) 500 MG tablet Take 2 tablets (1,000 mg total) by mouth every 6 (six) hours as needed. 07/17/20   Darr, Gerilyn Pilgrim, PA-C  ?ibuprofen (ADVIL) 200 MG tablet Take 200 mg by mouth every 6 (six) hours as needed.    [provider]  ?tamsulosin (FLOMAX) 0.4 MG CAPS capsule Take 1 capsule (0.4 mg total) by mouth daily. 07/17/20   Darr, Gerilyn Pilgrim, PA-C  ? ? ?Family History ?Family History  ?Problem Relation Age of Onset  ? Mental illness Mother   ? ? ?Social History ?Social History  ? ?Tobacco Use  ? Smoking status: Never  ? Smokeless tobacco: Never  ?Vaping Use  ? Vaping Use: Never used  ?Substance Use Topics  ? Alcohol use: Yes  ?  Comment: beer once in a while  ? Drug use: No  ? ? ? ?Allergies   ?Patient has no known allergies. ? ? ?Review of Systems ?Review of Systems  ?Constitutional: Negative.   ?Respiratory: Negative.    ?Cardiovascular: Negative.   ?Genitourinary:  Positive for dysuria, flank pain, frequency and hematuria.  Negative for decreased urine volume, difficulty urinating, enuresis, genital sores, penile discharge, penile pain, penile swelling, scrotal swelling, testicular pain and urgency.  ?Skin: Negative.   ? ? ?Physical Exam ?Triage Vital Signs ?ED Triage Vitals  ?Enc Vitals Group  ?   BP 04/10/22 1411 112/82  ?   Pulse Rate 04/10/22 1411 79  ?   Resp 04/10/22 1411 18  ?   Temp 04/10/22 1411 97.8 ?F (36.6 ?C)  ?   Temp Source 04/10/22 1411 Oral  ?   SpO2 04/10/22 1411 98 %  ?   Weight --   ?   Height --   ?   Head Circumference --   ?   Peak Flow --   ?   Pain Score 04/10/22 1412 6  ?   Pain Loc --   ?   Pain Edu? --   ?   Excl. in GC? --   ? ?No data found. ? ?Updated Vital Signs ?BP 112/82 (BP Location: Left Arm)   Pulse 79   Temp 97.8 ?F (36.6 ?C) (Oral)   Resp 18   SpO2 98%  ? ?Visual Acuity ?Right Eye Distance:   ?Left Eye Distance:   ?Bilateral Distance:   ? ?Right Eye Near:   ?Left Eye Near:    ?Bilateral Near:    ? ?Physical Exam ?Constitutional:   ?  Appearance: Normal appearance.  ?HENT:  ?   Head: Normocephalic.  ?Eyes:  ?   Extraocular Movements: Extraocular movements intact.  ?Pulmonary:  ?   Effort: Pulmonary effort is normal.  ?Abdominal:  ?   General: Abdomen is flat. Bowel sounds are normal. There is no distension.  ?   Palpations: Abdomen is soft.  ?   Tenderness: There is no abdominal tenderness. There is right CVA tenderness. There is no left CVA tenderness.  ?Skin: ?   General: Skin is warm and dry.  ?Neurological:  ?   Mental Status: He is alert and oriented to person, place, and time. Mental status is at baseline.  ?Psychiatric:     ?   Mood and Affect: Mood normal.     ?   Behavior: Behavior normal.  ? ? ? ?UC Treatments / Results  ?Labs ?(all labs ordered are listed, but only abnormal results are displayed) ?Labs Reviewed - No data to display ? ?EKG ? ? ?Radiology ?No results found. ? ?Procedures ?Procedures (including critical care time) ? ?Medications Ordered in UC ?Medications - No data to  display ? ?Initial Impression / Assessment and Plan / UC Course  ?I have reviewed the triage vital signs and the nursing notes. ? ?Pertinent labs & imaging results that were available during my care of the patient were reviewed by me and considered in my medical decision making (see chart for details). ? ?Acute cystitis with hematuria ?Kidney stone ? ?Vital signs are stable and well patient visibly uncomfortable he is in no signs of distress, urine sample filled with bright red blood, urinalysis positive with nitrates, symptomology is consistent with both an infection and kidney stones, will move forward with coverage for both due to patient's history, Macrobid twice daily for 5 days prescribed and Flomax daily for 14 days or until symptoms have resolved, oxycodone IR 5 mg every 4 hours prescribed for management of pain, PDMP reviewed, low risk, 18 tablets to be dispensed, advised patient to strain urine , given strict precautions for worsening severity symptoms to follow-up at the nearest emergency department for evaluation for obstruction or worsening infection ? ?Final Clinical Impressions(s) / UC Diagnoses  ? ?Final diagnoses:  ?None  ? ? ? ?Discharge Instructions   ? ?  ?Today you are being treated for kidney stone ? ?Take Flomax daily for 14 days, this medicine is to help relax the bladder allowing the stone with the ease pass with ease ? ?Please follow-up with your primary doctor for further evaluation ? ?At any point if your symptoms worsen in severity you will need to go to the nearest emergency department for imaging and evaluation for an obstruction ? ? ?ED Prescriptions   ?None ?  ? ?PDMP not reviewed this encounter. ?  ?Valinda Hoar, NP ?04/10/22 1449 ? ?

## 2022-04-11 ENCOUNTER — Emergency Department (HOSPITAL_COMMUNITY): Payer: Self-pay

## 2022-04-11 MED ORDER — KETOROLAC TROMETHAMINE 30 MG/ML IJ SOLN
30.0000 mg | Freq: Once | INTRAMUSCULAR | Status: DC
  Filled 2022-04-11: qty 1

## 2022-04-11 MED ORDER — HYDROCODONE-ACETAMINOPHEN 5-325 MG PO TABS
2.0000 | ORAL_TABLET | Freq: Once | ORAL | Status: AC
  Administered 2022-04-11: 2 via ORAL
  Filled 2022-04-11: qty 2

## 2022-04-11 MED ORDER — ONDANSETRON HCL 4 MG/2ML IJ SOLN
4.0000 mg | Freq: Once | INTRAMUSCULAR | Status: DC
  Filled 2022-04-11: qty 2

## 2022-04-11 NOTE — Discharge Instructions (Signed)
Take the medications given at urgent care as prescribed.  Return if symptoms worsen.  The stone should hopefully pass in the next 24-48 hours. ? ?Don't take the oxycodone for at least 6 hours.  We gave you some here tonight. ?

## 2022-04-11 NOTE — ED Provider Notes (Signed)
?[place] DEPT ?[facility] Emergency Department ?Provider Note ?MRN:  [id number]  ?Arrival date & time: 04/11/22    ? ?Chief Complaint   ?Abdominal Pain ?  ?History of Present Illness   ?Jerry Booker is a 41 y.o. year-old male presents to the ED with chief complaint of right sided flank pain.  Onset of symptoms was about 3 days ago, has been gradually worsening.  Was seen at urgent care today and prescribed flomax, percocet, and macrobid.  States that the pain worsened, so he came to the ED.  Has hx of KS. ? ?History provided by patient. ? ? ?Review of Systems  ?Pertinent review of systems noted in HPI.  ? ? ?Physical Exam  ? ?Vitals:  ? 04/10/22 2358 04/11/22 0136  ?BP: 116/79 112/80  ?Pulse: 88 83  ?Resp: 18 16  ?Temp:    ?SpO2: 94% 96%  ?  ?CONSTITUTIONAL:  well-appearing, NAD ?NEURO:  Alert and oriented x 3, CN 3-12 grossly intact ?EYES:  eyes equal and reactive ?ENT/NECK:  Supple, no stridor  ?CARDIO:  appears well-perfused  ?PULM:  No respiratory distress ?GI/GU:  non-distended,  ?MSK/SPINE:  No gross deformities, no edema, moves all extremities  ?SKIN:  no rash, atraumatic ? ? ?*Additional and/or pertinent findings included in MDM below ? ?Diagnostic and Interventional Summary  ? ? EKG Interpretation ? ?Date/Time:    ?Ventricular Rate:    ?PR Interval:    ?QRS Duration:   ?QT Interval:    ?QTC Calculation:   ?R Axis:     ?Text Interpretation:   ?  ? ?  ? ?Labs Reviewed  ?URINALYSIS, ROUTINE W REFLEX MICROSCOPIC - Abnormal; Notable for the following components:  ?    Result Value  ? APPearance CLOUDY (*)   ? Hgb urine dipstick SMALL (*)   ? Ketones, ur 20 (*)   ? Protein, ur 30 (*)   ? All other components within normal limits  ?CBC - Abnormal; Notable for the following components:  ? WBC 13.8 (*)   ? RDW 11.2 (*)   ? All other components within normal limits  ?BASIC METABOLIC PANEL - Abnormal; Notable for the following components:  ? Glucose, Bld 134 (*)   ? All other components within normal  limits  ?  ?CT Renal Stone Study  ?Final Result  ?  ?  ?Medications  ?ketorolac (TORADOL) 30 MG/ML injection 30 mg (has no administration in time range)  ?ondansetron (ZOFRAN) injection 4 mg (has no administration in time range)  ?ibuprofen (ADVIL) tablet 600 mg (600 mg Oral Given 04/10/22 2140)  ?HYDROcodone-acetaminophen (NORCO/VICODIN) 5-325 MG per tablet 2 tablet (2 tablets Oral Given 04/11/22 0132)  ?  ? ?Procedures  /  Critical Care ?Procedures ? ?ED Course and Medical Decision Making  ?I have reviewed the triage vital signs, the nursing notes, and pertinent available records from the EMR. ? ?Complexity of Problems Addressed: ?High Complexity: Acute illness/injury posing a threat to life or bodily function, requiring emergent diagnostic workup, evaluation, and treatment as below. ?Comorbidities affecting this illness/injury include: ?Prior kidney stones ?Social Determinants Affecting Care: ? No clinically significant social determinants affecting this chief complaint.. ? ? ?ED Course: ?After considering the following differential, kidney stone, UTI, I ordered norco for pain. ?I personally interpreted the labs which are notable for mild leukocytosis, UA inconsistent with infection. ?I visualized the CT, which is notable for 42mm UVJ stone and mild hydronephrosis and agree with radiologist interpretation.. ? ?  ? ?Consultants: ?No consultations were  needed in caring for this patient. ? ?Treatment and Plan: ?Feels improved after treatment in ED.  Continue flomax and percocet prescribed at [facility].  Strain urine.  Urology follow-up. ? ?Emergency department workup does not suggest an emergent condition requiring admission or immediate intervention beyond  what has been performed at this time. The patient is safe for discharge and has  been instructed to return immediately for worsening symptoms, change in  symptoms or any other concerns ? ? ? ?Final Clinical Impressions(s) / ED Diagnoses  ? ?  ICD-10-CM   ?1. Kidney stone   N20.0   ?  ?  ?ED Discharge Orders   ? ? None  ? ?  ?  ? ? ?Discharge Instructions Discussed with and Provided to Patient:  ? ? ?Discharge Instructions   ? ?  ?Take the medications given at urgent care as prescribed.  Return if symptoms worsen.  The stone should hopefully pass in the next 24-48 hours. ? ?Don't take the oxycodone for at least 6 hours.  We gave you some here tonight. ? ? ? ?  ?Montine Circle, PA-C ?04/11/22 0154 ? ?  ?Delora Fuel, MD ?[id number] [phone] ? ?

## 2022-04-11 NOTE — ED Notes (Signed)
Pt A&Ox4 ambulatory at d/c with independent steady gait, NAD. Pt verbalized understanding of d/c instructions and follow up care. °

## 2022-05-11 ENCOUNTER — Ambulatory Visit (HOSPITAL_COMMUNITY)
Admission: EM | Admit: 2022-05-11 | Discharge: 2022-05-11 | Disposition: A | Discharge status: Home / Self Care | Payer: Self-pay | Attending: Emergency Medicine | Admitting: Emergency Medicine

## 2022-05-11 DIAGNOSIS — M545 Low back pain, unspecified: Secondary | ICD-10-CM

## 2022-05-11 LAB — POCT URINALYSIS DIPSTICK, ED / UC
Bilirubin Urine: NEGATIVE
Glucose, UA: NEGATIVE mg/dL
Hgb urine dipstick: NEGATIVE
Ketones, ur: NEGATIVE mg/dL
Leukocytes,Ua: NEGATIVE
Nitrite: NEGATIVE
Protein, ur: NEGATIVE mg/dL
Specific Gravity, Urine: 1.025 (ref 1.005–1.030)
Urobilinogen, UA: 0.2 mg/dL (ref 0.0–1.0)
pH: 6 (ref 5.0–8.0)

## 2022-05-11 MED ORDER — TAMSULOSIN HCL 0.4 MG PO CAPS: 0.4000 mg | ORAL_CAPSULE | Freq: Every day | ORAL | 0 refills | Status: DC

## 2022-05-11 NOTE — ED Triage Notes (Signed)
1 week of flank pain. He report some sharp back pain with some pulling in his back. He reports having a h/o kidney stones.

## 2022-05-11 NOTE — ED Provider Notes (Signed)
[facility]    CSN: [id number] Arrival date & time: 05/11/22  1536      History   Chief Complaint Chief Complaint  Patient presents with   Flank Pain    HPI Jerry Booker is a 41 y.o. male.  Presents with 1 week history of low back pain.  No pain today.  States sharp aching in bilateral back.  He does a lot of heavy lifting and bending at work.  He was seen in April for severe flank pain and diagnosed with kidney stone.  He was put on Flomax at this time and states the stone passed last week.  He discontinued Flomax after it was passed. Denies fever, chills, abdominal pain, vomiting/diarrhea, dysuria, urgency/frequency.  Past Medical History:  Diagnosis Date   Chest pain     Patient Active Problem List   Diagnosis Date Noted   Nephrolithiasis 07/19/2020    No past surgical history on file.     Home Medications    Prior to Admission medications   Medication Sig Start Date End Date Taking? Authorizing Provider  acetaminophen (TYLENOL) 500 MG tablet Take 2 tablets (1,000 mg total) by mouth every 6 (six) hours as needed. 07/17/20   Darr, Edison Nasuti, PA-C  ibuprofen (ADVIL) 200 MG tablet Take 200 mg by mouth every 6 (six) hours as needed.    [provider]  nitrofurantoin, macrocrystal-monohydrate, (MACROBID) 100 MG capsule Take 1 capsule (100 mg total) by mouth 2 (two) times daily. 04/10/22   Hans Eden, NP  tamsulosin (FLOMAX) 0.4 MG CAPS capsule Take 1 capsule (0.4 mg total) by mouth daily. 05/11/22   Aleira Deiter, Wells Guiles, PA-C    Family History Family History  Problem Relation Age of Onset   Mental illness Mother     Social History Social History   Tobacco Use   Smoking status: Never   Smokeless tobacco: Never  Vaping Use   Vaping Use: Never used  Substance Use Topics   Alcohol use: Yes    Comment: beer once in a while   Drug use: No     Allergies   Patient has no known allergies.   Review of Systems Review of Systems   Genitourinary:  Positive for flank pain.  As per HPI  Physical Exam Triage Vital Signs ED Triage Vitals  Enc Vitals Group     BP 05/11/22 1603 124/78     Pulse Rate 05/11/22 1603 93     Resp 05/11/22 1603 18     Temp 05/11/22 1603 97.8 F (36.6 C)     Temp Source 05/11/22 1603 Oral     SpO2 05/11/22 1603 100 %     Weight --      Height --      Head Circumference --      Peak Flow --      Pain Score 05/11/22 1655 0     Pain Loc --      Pain Edu? --      Excl. in [place]? --    No data found.  Updated Vital Signs BP 124/78 (BP Location: Left Arm)   Pulse 93   Temp 97.8 F (36.6 C) (Oral)   Resp 18   SpO2 100%   Visual Acuity Right Eye Distance:   Left Eye Distance:   Bilateral Distance:    Right Eye Near:   Left Eye Near:    Bilateral Near:     Physical Exam Vitals and nursing note reviewed.  Constitutional:  General: He is not in acute distress. HENT:     Nose: Nose normal.     Mouth/Throat:     Mouth: Mucous membranes are moist.     Pharynx: Oropharynx is clear.  Eyes:     Extraocular Movements: Extraocular movements intact.     Conjunctiva/sclera: Conjunctivae normal.     Pupils: Pupils are equal, round, and reactive to light.  Cardiovascular:     Rate and Rhythm: Normal rate and regular rhythm.     Heart sounds: Normal heart sounds.  Pulmonary:     Effort: Pulmonary effort is normal.     Breath sounds: Normal breath sounds.  Abdominal:     General: Bowel sounds are normal.     Palpations: Abdomen is soft.     Tenderness: There is no abdominal tenderness. There is no right CVA tenderness or left CVA tenderness.  Musculoskeletal:        General: No swelling or tenderness. Normal range of motion.     Cervical back: Normal.     Thoracic back: Normal.     Lumbar back: Normal.     Comments: He is nontender to palpation bilateral low back.  Reports no pain.  Neurological:     Mental Status: He is alert and oriented to person, place, and time.      Cranial Nerves: Cranial nerves 2-12 are intact.     Sensory: Sensation is intact.     Motor: Motor function is intact. No weakness.     Coordination: Coordination is intact.     Gait: Gait is intact.     Deep Tendon Reflexes: Reflexes are normal and symmetric.     Comments: Strength 5/5 all extremities    UC Treatments / Results  Labs (all labs ordered are listed, but only abnormal results are displayed) Labs Reviewed  POCT URINALYSIS DIPSTICK, ED / UC    EKG  Radiology No results found.  Procedures Procedures (including critical care time)  Medications Ordered in UC Medications - No data to display  Initial Impression / Assessment and Plan / UC Course  I have reviewed the triage vital signs and the nursing notes.  Pertinent labs & imaging results that were available during my care of the patient were reviewed by me and considered in my medical decision making (see chart for details).   Urinalysis in clinic today negative.  Physical exam very reassuring, no concern for kidney stone or urinary tract infection at this time.  Believe this may be musculoskeletal.  Due to his heavy lifting and bending at work, moving [employer] it is likely he has strained his low back.  We discussed doing light activity at work and avoiding heavy lifting. He can try Tylenol if pain continues.  Also recommend heat pad for lower back.  Recommend to follow-up with primary care and his urologist if symptoms persist.  He is glad to hear his urine is clean today.  Return precautions discussed. Patient agrees to plan and is discharged in stable condition.  Final Clinical Impressions(s) / UC Diagnoses   Final diagnoses:  Acute bilateral low back pain without sciatica     Discharge Instructions      Please restart flomax daily. I recommend to follow up with your urologist. You can take tylenol for pain, use ice or hot pad.  Please return to the urgent care or emergency department if symptoms  worsen or do not improve.    ED Prescriptions     Medication Sig Dispense  Auth. Provider   tamsulosin (FLOMAX) 0.4 MG CAPS capsule Take 1 capsule (0.4 mg total) by mouth daily. 30 capsule Bohdi Leeds, Wells Guiles, PA-C      PDMP not reviewed this encounter.   Randi Poullard, Wells Guiles, [place] 05/11/22 1718

## 2022-05-11 NOTE — Discharge Instructions (Signed)
Please restart flomax daily. I recommend to follow up with your urologist. You can take tylenol for pain, use ice or hot pad.  Please return to the urgent care or emergency department if symptoms worsen or do not improve.

## 2022-07-16 ENCOUNTER — Other Ambulatory Visit: Payer: Self-pay

## 2022-07-16 ENCOUNTER — Encounter (HOSPITAL_COMMUNITY): Payer: Self-pay | Admitting: *Deleted

## 2022-07-16 ENCOUNTER — Ambulatory Visit (HOSPITAL_COMMUNITY): Payer: Self-pay

## 2022-07-16 ENCOUNTER — Ambulatory Visit (HOSPITAL_COMMUNITY)
Admission: EM | Admit: 2022-07-16 | Discharge: 2022-07-16 | Disposition: A | Discharge status: Home / Self Care | Payer: Self-pay | Attending: Internal Medicine | Admitting: Internal Medicine

## 2022-07-16 ENCOUNTER — Ambulatory Visit (INDEPENDENT_AMBULATORY_CARE_PROVIDER_SITE_OTHER): Payer: Self-pay

## 2022-07-16 DIAGNOSIS — R197 Diarrhea, unspecified: Secondary | ICD-10-CM

## 2022-07-16 DIAGNOSIS — R109 Unspecified abdominal pain: Secondary | ICD-10-CM

## 2022-07-16 DIAGNOSIS — R195 Other fecal abnormalities: Secondary | ICD-10-CM

## 2022-07-16 HISTORY — DX: Calculus of kidney: N20.0

## 2022-07-16 MED ORDER — DICYCLOMINE HCL 10 MG PO CAPS: 10.0000 mg | ORAL_CAPSULE | Freq: Three times a day (TID) | ORAL | 2 refills | Status: DC

## 2022-07-16 NOTE — Discharge Instructions (Addendum)
The abdominal xray shows scattered stool and gas throughout your large colon, but no obstruction. It could be you had retained stool on the proximal colon area, but you may have been emptying some from the distal area. I will have you stay on bland diet, and try medication for the cramps.  If you dont improve in 2-3 days, then see your primary care doctor for follow up, specially if you get diarrhea, you may need stool testing

## 2022-07-16 NOTE — ED Provider Notes (Signed)
[facility]    CSN: [id number] Arrival date & time: 07/16/22  1938      History   Chief Complaint Chief Complaint  Patient presents with   Abdominal Pain   Diarrhea    HPI Jerry Booker is a 41 y.o. male who presents with onset of loose stools of 5 per day average which occur right after eating,  and he describes the pain as cramps which happens within 20--30 minutes, whether is solids or water. Has not been eating much, mostly just drinking fluids. Denies being out of the country or on antibiotics in the past month. No one else has his symptoms at home. Denies hx of constipation or fever. His appetite has been low.     Past Medical History:  Diagnosis Date   Chest pain    Renal stone     Patient Active Problem List   Diagnosis Date Noted   Nephrolithiasis 07/19/2020    History reviewed. No pertinent surgical history.     Home Medications    Prior to Admission medications   Medication Sig Start Date End Date Taking? Authorizing Provider  dicyclomine (BENTYL) 10 MG capsule Take 1 capsule (10 mg total) by mouth 4 (four) times daily -  before meals and at bedtime. Prn cramps 07/16/22  Yes Rodriguez-Southworth, Nettie Elm, PA-C  acetaminophen (TYLENOL) 500 MG tablet Take 2 tablets (1,000 mg total) by mouth every 6 (six) hours as needed. 07/17/20   Darr, Gerilyn Pilgrim, PA-C  ibuprofen (ADVIL) 200 MG tablet Take 200 mg by mouth every 6 (six) hours as needed.    [provider]  tamsulosin (FLOMAX) 0.4 MG CAPS capsule Take 1 capsule (0.4 mg total) by mouth daily. 05/11/22   Rising, Lurena Joiner, PA-C    Family History Family History  Problem Relation Age of Onset   Mental illness Mother     Social History Social History   Tobacco Use   Smoking status: Never   Smokeless tobacco: Never  Vaping Use   Vaping Use: Never used  Substance Use Topics   Alcohol use: Yes    Comment: beer once in a while   Drug use: No     Allergies   Patient has no known  allergies.   Review of Systems Review of Systems  Constitutional:  Positive for appetite change. Negative for fever.  Respiratory:  Negative for cough.   Gastrointestinal:  Positive for abdominal pain. Negative for blood in stool, constipation, diarrhea, nausea and vomiting.       Loose stool     Physical Exam Triage Vital Signs ED Triage Vitals  Enc Vitals Group     BP 07/16/22 1953 109/65     Pulse Rate 07/16/22 1953 89     Resp 07/16/22 1953 16     Temp 07/16/22 1953 98.5 F (36.9 C)     Temp Source 07/16/22 1953 Oral     SpO2 07/16/22 1953 95 %     Weight --      Height --      Head Circumference --      Peak Flow --      Pain Score 07/16/22 1955 0     Pain Loc --      Pain Edu? --      Excl. in [place]? --    No data found.  Updated Vital Signs BP 109/65   Pulse 89   Temp 98.5 F (36.9 C) (Oral)   Resp 16   SpO2 95%  Visual Acuity Right Eye Distance:   Left Eye Distance:   Bilateral Distance:    Right Eye Near:   Left Eye Near:    Bilateral Near:     Physical Exam Vitals and nursing note reviewed.  Constitutional:      General: He is not in acute distress.    Appearance: He is not toxic-appearing.  HENT:     Right Ear: External ear normal.     Left Ear: External ear normal.  Eyes:     General: No scleral icterus.    Conjunctiva/sclera: Conjunctivae normal.  Pulmonary:     Effort: Pulmonary effort is normal.  Abdominal:     General: Abdomen is protuberant. There is no distension.     Palpations: Abdomen is soft. There is no fluid wave, hepatomegaly or splenomegaly.     Tenderness: There is abdominal tenderness. There is no guarding or rebound.     Comments: Has fullness on the R of the umbilicus which is tender  Musculoskeletal:        General: Normal range of motion.  Skin:    General: Skin is warm and dry.     Findings: No rash.  Neurological:     Mental Status: He is alert and oriented to person, place, and time.     Gait: Gait normal.   Psychiatric:        Mood and Affect: Mood normal.        Behavior: Behavior normal.        Thought Content: Thought content normal.        Judgment: Judgment normal.      UC Treatments / Results  Labs (all labs ordered are listed, but only abnormal results are displayed) Labs Reviewed - No data to display  EKG   Radiology DG Abd 2 Views  Result Date: 07/16/2022 CLINICAL DATA:  Abdominal pain with intermittent generalized abdominal cramping and diarrhea for 3 days. EXAM: ABDOMEN - 2 VIEW COMPARISON:  CT 04/11/2022 FINDINGS: Scattered gas and stool throughout the colon. No small or large bowel distention. No free intra-abdominal air. No abnormal air-fluid levels. No radiopaque stones. Visualized bones and soft tissue contours are intact. IMPRESSION: Normal nonobstructive bowel gas pattern with scattered stool throughout the colon. Electronically Signed   By: Burman Nieves M.D.   On: 07/16/2022 20:39    Procedures Procedures (including critical care time)  Medications Ordered in UC Medications - No data to display  Initial Impression / Assessment and Plan / UC Course  I have reviewed the triage vital signs and the nursing notes.  Pertinent  imaging results that were available during my care of the patient were reviewed by me and considered in my medical decision making (see chart for details).  Abdominal cramps and loose stools of unknown cause. Could be he had more stool on proximal area, and movement of the stool to distal colon is causing the cramps, since he still has scattered stool in his color after frequent BM's this past 3 days. I will have him try Bentyl tid before meal. Told if he does not get better, to FU with PCP in 3-5 days in case he may need stool test or other studies done.   Final Clinical Impressions(s) / UC Diagnoses   Final diagnoses:  Abdominal cramping  Loose stools     Discharge Instructions      The abdominal xray shows scattered stool and gas  throughout your large colon, but no obstruction. It could be you  had retained stool on the proximal colon area, but you may have been emptying some from the distal area. I will have you stay on bland diet, and try medication for the cramps.  If you dont improve in 2-3 days, then see your primary care doctor for follow up, specially if you get diarrhea, you may need stool testing       ED Prescriptions     Medication Sig Dispense Auth. Provider   dicyclomine (BENTYL) 10 MG capsule Take 1 capsule (10 mg total) by mouth 4 (four) times daily -  before meals and at bedtime. Prn cramps 12 capsule Rodriguez-Southworth, Nettie Elm, PA-C      PDMP not reviewed this encounter.   Garey Ham, Cordelia Poche 07/16/22 2057

## 2022-07-16 NOTE — ED Triage Notes (Addendum)
C/O intermittent generalized abd cramping and diarrhea onset 3 days ago without fevers. States diarrhea not watery, just really loose. Denies vomiting, but c/o poor appetite. Has not taken any anti-diarrheals to help with sxs.

## 2023-04-25 IMAGING — CT CT RENAL STONE PROTOCOL
2 of 4 series · 16 of 46 positions shown, 18 images · non-contrast
Comparison: 07/19/2020

CLINICAL DATA: Nephrolithiasis



[Series 3: stone study 5.0 i30f 2 · axial · 0.92mm/px · z∈[+700,+1130]mm · 13 of 96 slices shown, 15 images]
[im 5/96  soft-tissue]
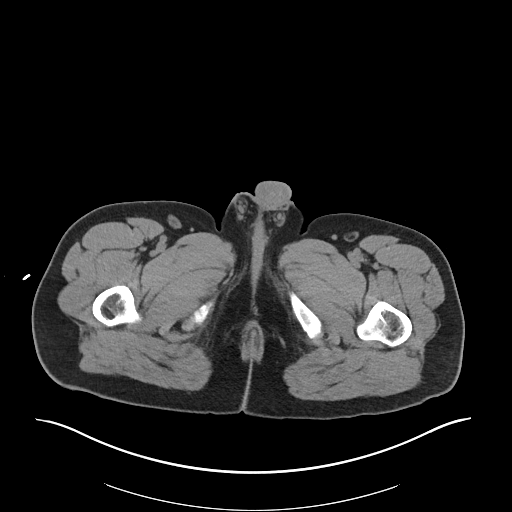
[im 5/96  bone]
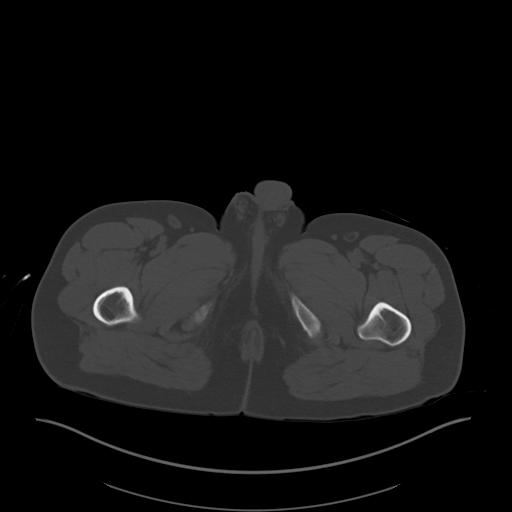
[im 13/96  soft-tissue]
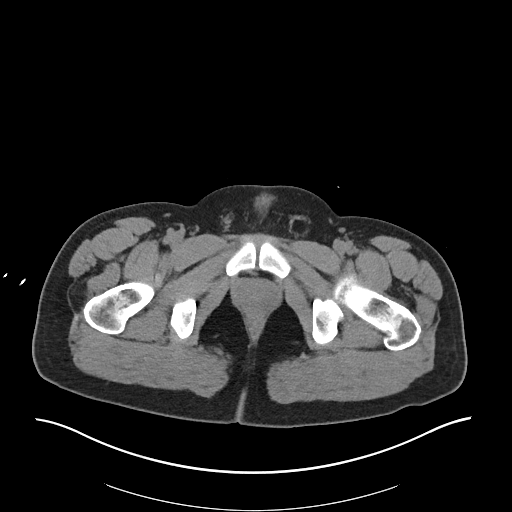
[im 21/96  soft-tissue]
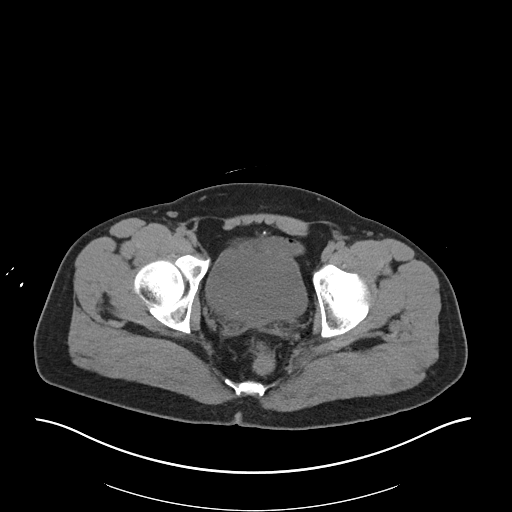
[im 25/96  soft-tissue]
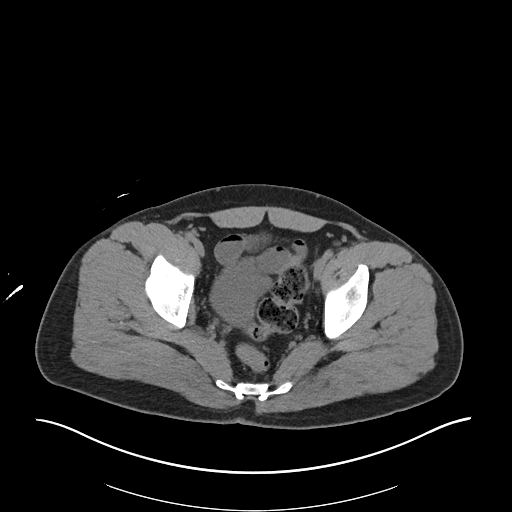
[im 34/96  soft-tissue]
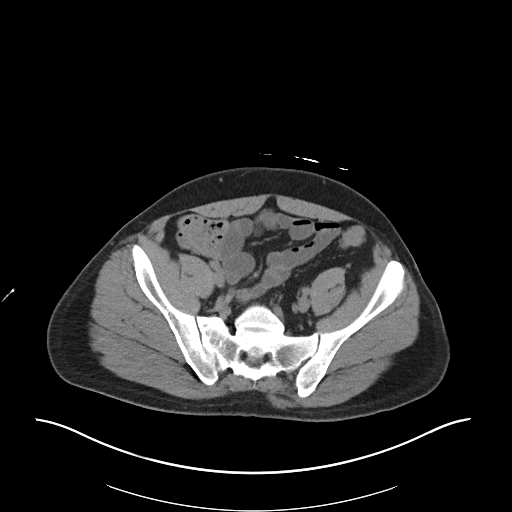
[im 42/96  soft-tissue]
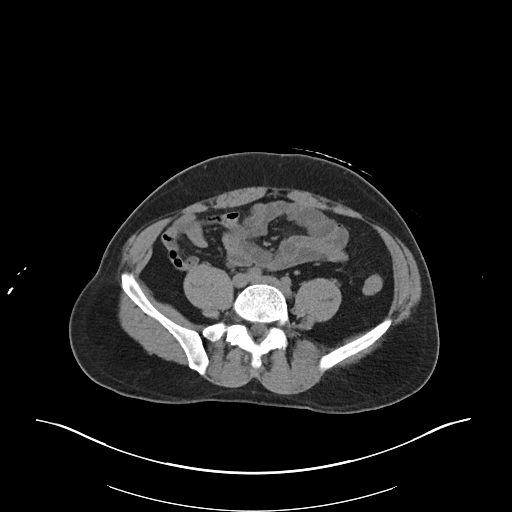
[im 50/96  soft-tissue]
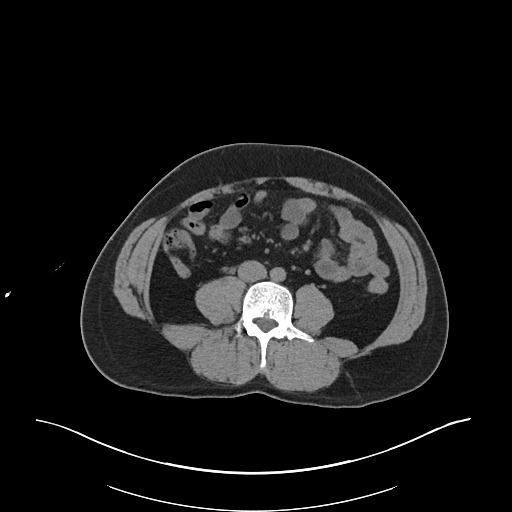
[im 54/96  soft-tissue]
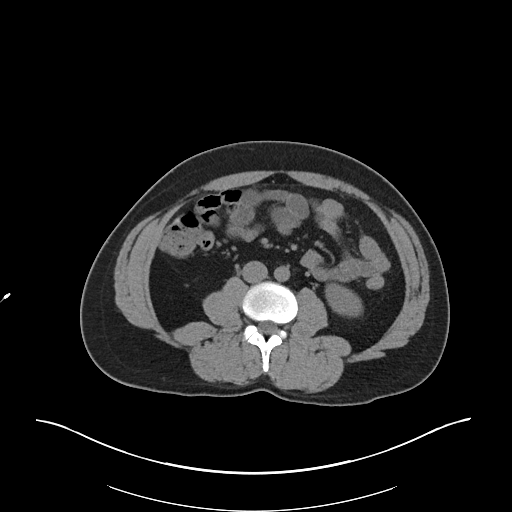
[im 62/96  soft-tissue]
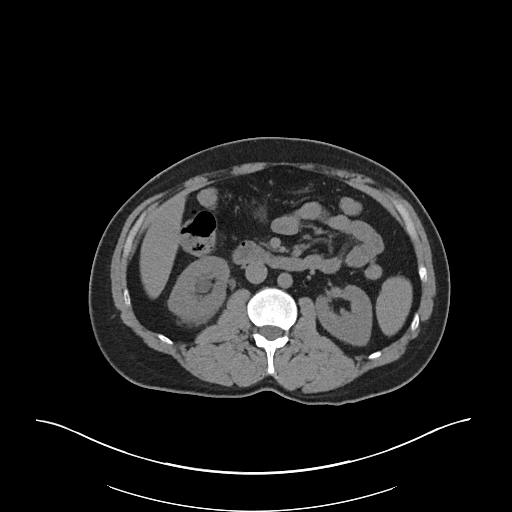
[im 62/96  bone]
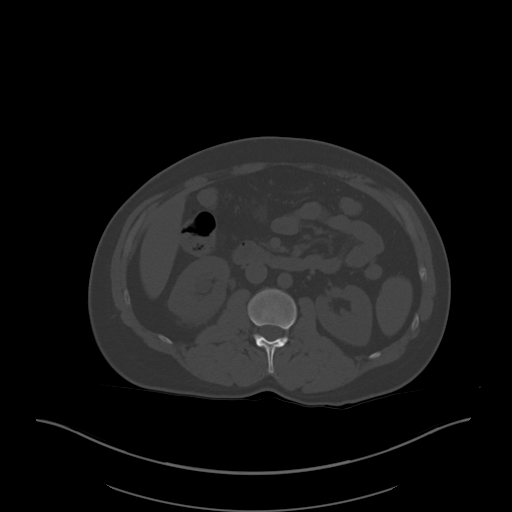
[im 71/96  soft-tissue]
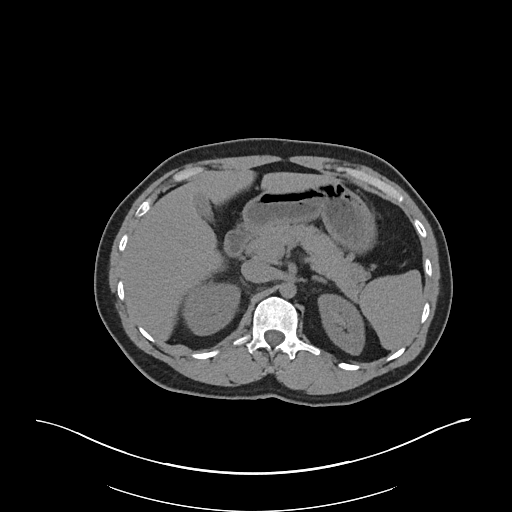
[im 75/96  soft-tissue]
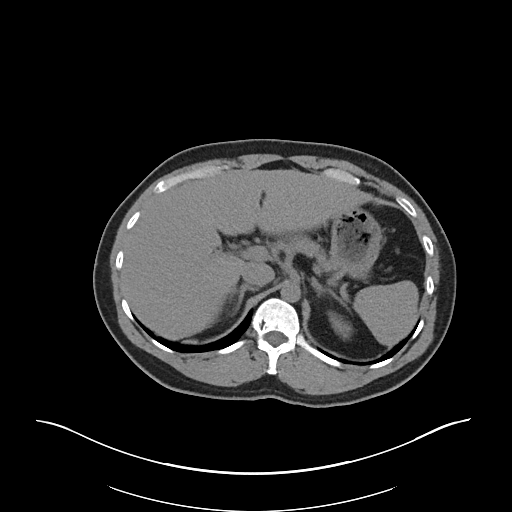
[im 83/96  soft-tissue]
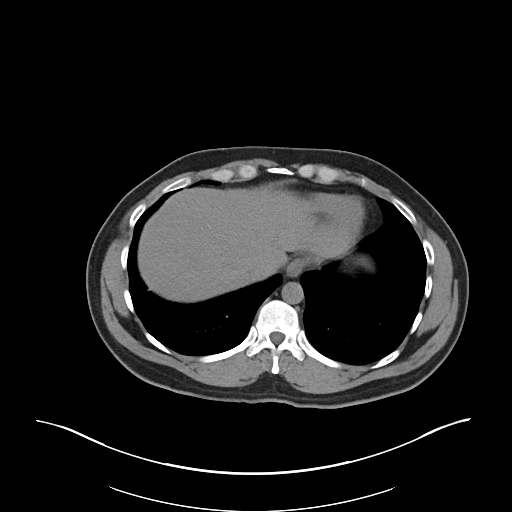
[im 91/96  soft-tissue]
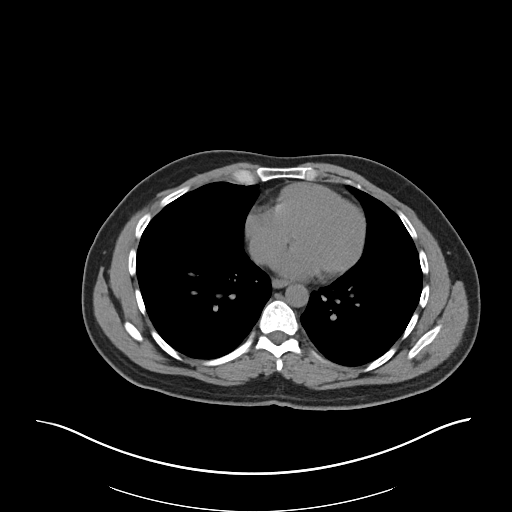

[Series 6: coronal soft tissue · coronal · 0.79mm/px · 3 of 93 slices shown]
[im 31/93  soft-tissue]
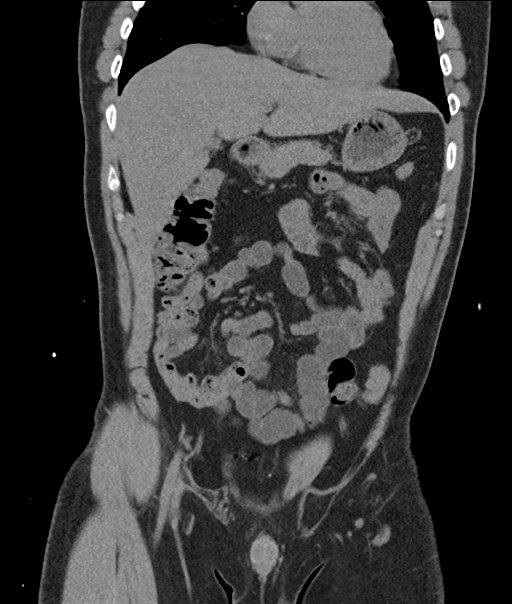
[im 41/93  soft-tissue]
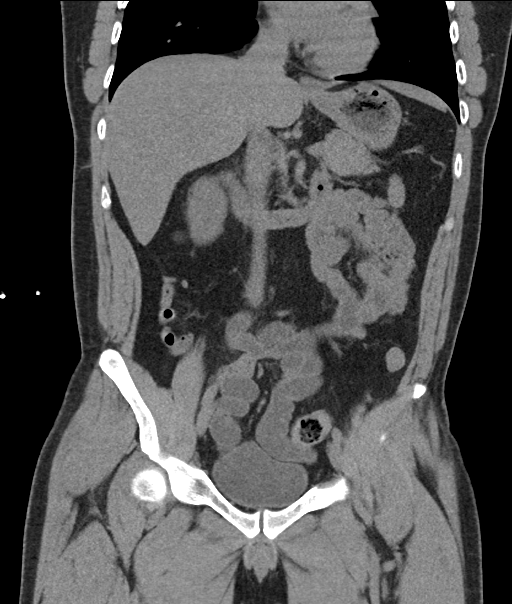
[im 52/93  soft-tissue]
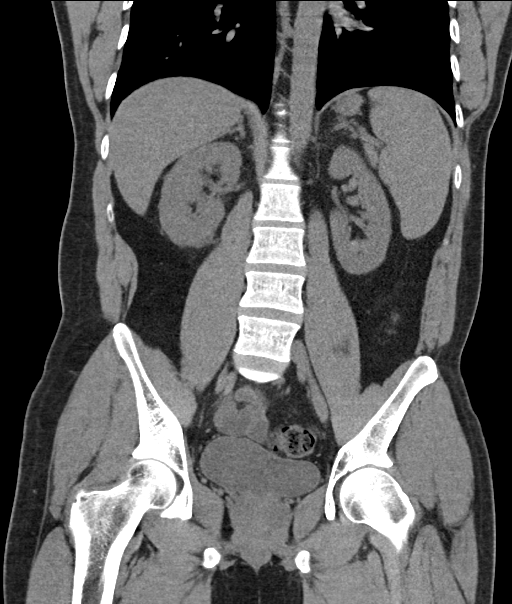

[16 of 46 positions shown; findings below may reference images not displayed]

FINDINGS: Lower Chest: Unchanged nodular subpleural focus in the left lower
lobe measuring 7 mm.

Hepatobiliary: Normal hepatic contours. No intra- or extrahepatic
biliary dilatation. The gallbladder is normal.

Pancreas: Normal pancreas. No ductal dilatation or peripancreatic
fluid collection.

Spleen: Normal.

Adrenals/Urinary Tract: The adrenal glands are normal. There is a 4
mm stone at the right ureterovesical junction that causes mild
hydroureteronephrosis. The left kidney and ureter are normal. The
urinary bladder is normal for degree of distention

Stomach/Bowel: There is no hiatal hernia. Normal duodenal course and
caliber. No small bowel dilatation or inflammation. No focal colonic
abnormality. Normal appendix.

Vascular/Lymphatic: Normal course and caliber of the major abdominal
vessels. No abdominal or pelvic lymphadenopathy.

Reproductive: Normal prostate size with symmetric seminal vesicles.

Other: None.

Musculoskeletal: No bony spinal canal stenosis or focal osseous
abnormality.
IMPRESSION: Mild right hydroureteronephrosis secondary to a 4 mm stone at the
right ureterovesical junction.

## 2023-09-13 ENCOUNTER — Encounter (HOSPITAL_COMMUNITY): Payer: Self-pay | Admitting: Physician Assistant

## 2023-09-13 ENCOUNTER — Ambulatory Visit (HOSPITAL_COMMUNITY)
Admission: EM | Admit: 2023-09-13 | Discharge: 2023-09-13 | Disposition: A | Discharge status: Home / Self Care | Payer: Self-pay | Attending: Physician Assistant | Admitting: Physician Assistant

## 2023-09-13 DIAGNOSIS — K2900 Acute gastritis without bleeding: Secondary | ICD-10-CM

## 2023-09-13 LAB — POCT URINALYSIS DIP (MANUAL ENTRY)
Bilirubin, UA: NEGATIVE
Glucose, UA: NEGATIVE mg/dL
Ketones, POC UA: NEGATIVE mg/dL
Leukocytes, UA: NEGATIVE
Nitrite, UA: NEGATIVE
Protein Ur, POC: 30 mg/dL — AB
Spec Grav, UA: 1.015 (ref 1.010–1.025)
Urobilinogen, UA: 0.2 E.U./dL
pH, UA: 8.5 — AB (ref 5.0–8.0)

## 2023-09-13 MED ORDER — ALUM & MAG HYDROXIDE-SIMETH 200-200-20 MG/5ML PO SUSP
ORAL | Status: AC
  Filled 2023-09-13: qty 30

## 2023-09-13 MED ORDER — ALUM & MAG HYDROXIDE-SIMETH 200-200-20 MG/5ML PO SUSP
30.0000 mL | Freq: Once | ORAL | Status: AC
  Administered 2023-09-13: 30 mL via ORAL

## 2023-09-13 MED ORDER — PANTOPRAZOLE SODIUM 40 MG PO TBEC: 40.0000 mg | DELAYED_RELEASE_TABLET | Freq: Every day | ORAL | 0 refills | Status: DC

## 2023-09-13 MED ORDER — LIDOCAINE VISCOUS HCL 2 % MT SOLN
15.0000 mL | Freq: Once | OROMUCOSAL | Status: AC
  Administered 2023-09-13: 15 mL via OROMUCOSAL

## 2023-09-13 MED ORDER — LIDOCAINE VISCOUS HCL 2 % MT SOLN
OROMUCOSAL | Status: AC
  Filled 2023-09-13: qty 15

## 2023-09-13 MED ORDER — SUCRALFATE 1 G PO TABS: 1.0000 g | ORAL_TABLET | Freq: Three times a day (TID) | ORAL | 0 refills | Status: DC

## 2023-09-13 NOTE — ED Provider Notes (Signed)
[facility]    CSN: [id number] Arrival date & time: 09/13/23  1007      History   Chief Complaint Chief Complaint  Patient presents with   Abdominal Pain    HPI Jerry Booker is a 42 y.o. male.   Patient was today with a several day history of abdominal pain that has worsened significantly over the past few days.  Reports that pain is rated 8 on a certain pain scale, described as cramping/sharpness, no aggravating factors identified.  He has not tried any over-the-counter medication for symptom management.  He denies history of gastrointestinal disorder including inflammatory bowel disease, irritable bowel disease, GERD.  He has never been diagnosed with H. pylori.  He does not take NSAIDs on a regular basis.  Reports really drinking alcohol.  He does not take GLP-1 agonist.  He has not tried any over-the-counter medication for symptom management.  Denies any changes to his diet or medications.  He denies any associated symptoms including fever, nausea, vomiting, melena, hematochezia.  Reports he is having normal bowel movements with last movement earlier today that was normal.    Past Medical History:  Diagnosis Date   Chest pain    Renal stone     Patient Active Problem List   Diagnosis Date Noted   Nephrolithiasis 07/19/2020    History reviewed. No pertinent surgical history.     Home Medications    Prior to Admission medications   Medication Sig Start Date End Date Taking? Authorizing Provider  pantoprazole (PROTONIX) 40 MG tablet Take 1 tablet (40 mg total) by mouth daily. 09/13/23  Yes Aleea Hendry, Denny Peon K, PA-C  sucralfate (CARAFATE) 1 g tablet Take 1 tablet (1 g total) by mouth with breakfast, with lunch, and with evening meal. 09/13/23  Yes Gerlad Pelzel K, PA-C  acetaminophen (TYLENOL) 500 MG tablet Take 2 tablets (1,000 mg total) by mouth every 6 (six) hours as needed. 07/17/20   Darr, Gerilyn Pilgrim, PA-C  dicyclomine (BENTYL) 10 MG capsule Take 1 capsule (10 mg  total) by mouth 4 (four) times daily -  before meals and at bedtime. Prn cramps 07/16/22   Rodriguez-Southworth, Nettie Elm, PA-C  ibuprofen (ADVIL) 200 MG tablet Take 200 mg by mouth every 6 (six) hours as needed.    [provider]  tamsulosin (FLOMAX) 0.4 MG CAPS capsule Take 1 capsule (0.4 mg total) by mouth daily. 05/11/22   Rising, Lurena Joiner, PA-C    Family History Family History  Problem Relation Age of Onset   Mental illness Mother     Social History Social History   Tobacco Use   Smoking status: Never   Smokeless tobacco: Never  Vaping Use   Vaping status: Never Used  Substance Use Topics   Alcohol use: Yes    Comment: beer once in a while   Drug use: No     Allergies   Patient has no known allergies.   Review of Systems Review of Systems  Constitutional:  Positive for activity change. Negative for appetite change, fatigue and fever.  HENT:  Negative for congestion.   Respiratory:  Negative for cough and shortness of breath.   Cardiovascular:  Negative for chest pain.  Gastrointestinal:  Positive for abdominal pain. Negative for blood in stool, constipation, diarrhea, nausea and vomiting.  Neurological:  Negative for dizziness, light-headedness and headaches.     Physical Exam Triage Vital Signs ED Triage Vitals  Encounter Vitals Group     BP 09/13/23 1037 105/71  Systolic BP Percentile --      Diastolic BP Percentile --      Pulse Rate 09/13/23 1037 84     Resp 09/13/23 1037 18     Temp 09/13/23 1037 98.6 F (37 C)     Temp src --      SpO2 09/13/23 1037 97 %     Weight --      Height --      Head Circumference --      Peak Flow --      Pain Score 09/13/23 1036 8     Pain Loc --      Pain Education --      Exclude from Growth Chart --    No data found.  Updated Vital Signs BP 105/71   Pulse 84   Temp 98.6 F (37 C)   Resp 18   SpO2 97%   Visual Acuity Right Eye Distance:   Left Eye Distance:   Bilateral Distance:    Right Eye  Near:   Left Eye Near:    Bilateral Near:     Physical Exam Vitals reviewed.  Constitutional:      General: He is awake.     Appearance: Normal appearance. He is well-developed. He is not ill-appearing.     Comments: Very pleasant male appears stated age no acute distress sitting comfortably in exam room  HENT:     Head: Normocephalic and atraumatic.     Mouth/Throat:     Pharynx: Uvula midline. No oropharyngeal exudate or posterior oropharyngeal erythema.  Cardiovascular:     Rate and Rhythm: Normal rate and regular rhythm.     Heart sounds: Normal heart sounds, S1 normal and S2 normal. No murmur heard. Pulmonary:     Effort: Pulmonary effort is normal.     Breath sounds: Normal breath sounds. No stridor. No wheezing, rhonchi or rales.     Comments: Clear to auscultation bilaterally Abdominal:     General: Bowel sounds are normal.     Palpations: Abdomen is soft.     Tenderness: There is generalized abdominal tenderness and tenderness in the right upper quadrant, epigastric area and left upper quadrant. There is no right CVA tenderness, left CVA tenderness, guarding or rebound. Negative signs include Murphy's sign.     Comments: Mild tenderness to palpation throughout abdomen but worse in epigastrium.  No evidence of acute abdomen on physical exam.  Neurological:     Mental Status: He is alert.  Psychiatric:        Behavior: Behavior is cooperative.      UC Treatments / Results  Labs (all labs ordered are listed, but only abnormal results are displayed) Labs Reviewed  POCT URINALYSIS DIP (MANUAL ENTRY) - Abnormal; Notable for the following components:      Result Value   Color, UA straw (*)    Clarity, UA cloudy (*)    Blood, UA trace-intact (*)    pH, UA 8.5 (*)    Protein Ur, POC =30 (*)    All other components within normal limits    EKG   Radiology No results found.  Procedures Procedures (including critical care time)  Medications Ordered in  UC Medications  alum & mag hydroxide-simeth (MAALOX/MYLANTA) 200-200-20 MG/5ML suspension 30 mL (30 mLs Oral Given 09/13/23 1103)  lidocaine (XYLOCAINE) 2 % viscous mouth solution 15 mL (15 mLs Mouth/Throat Given 09/13/23 1103)    Initial Impression / Assessment and Plan / UC Course  I have  reviewed the triage vital signs and the nursing notes.  Pertinent labs & imaging results that were available during my care of the patient were reviewed by me and considered in my medical decision making (see chart for details).     Patient is well-appearing, afebrile, nontoxic, nontachycardic.  Vital signs of physical exam are reassuring today.  Given his clinical presentation I am concerned for gastritis.  He was given a GI cocktail that improved his pain from 8 on his return pain scale to 2.  Discussed that I would like to start medications to help manage this and see if it provides relief of symptoms.  He was started on Carafate before meals for the next week as well as 1 month of PPI.  Discussed that he is to take the PPI on empty stomach.  He did report some increased belching as well as halitosis we discussed this can be related to GERD but if his symptoms or not improving quickly it is worthwhile to follow-up with GI specialist to consider H. pylori testing since we do not have these abilities in urgent care.  He was given the contact information for GI specialist with instruction to call to schedule an appointment.  Discussed that he is to eat a bland diet and avoid NSAIDs, alcohol.  If he has any recurrent or worsening symptoms including severe abdominal pain, nausea/vomiting, melena, hematochezia, weakness he needs to be seen emergently.  Strict return precautions given.  All questions were answered to patient satisfaction.  Final Clinical Impressions(s) / UC Diagnoses   Final diagnoses:  Acute gastritis, presence of bleeding unspecified, unspecified gastritis type     Discharge Instructions       I am glad that you are feeling better after the medication.  Please start pantoprazole daily.  Take this 30 minutes before meal or 2 hours after.  Take sucralfate before each meal for the next week.  Eat a bland diet and avoid spicy/acidic/fatty foods.  Avoid alcohol as well as NSAIDs (aspirin, ibuprofen/Advil, naproxen/Aleve).  I would like you to follow-up with a gastroenterologist.  Call them to schedule an appointment.  If you have recurrent symptoms or if anything changes and you have severe pain, nausea/vomiting, blood in your stool, blood in your vomit, dark black stools, weakness you need to be seen immediately.     ED Prescriptions     Medication Sig Dispense Auth. Provider   sucralfate (CARAFATE) 1 g tablet Take 1 tablet (1 g total) by mouth with breakfast, with lunch, and with evening meal. 21 tablet Magdelyn Roebuck K, PA-C   pantoprazole (PROTONIX) 40 MG tablet Take 1 tablet (40 mg total) by mouth daily. 30 tablet Britni Driscoll, Noberto Retort, PA-C      PDMP not reviewed this encounter.   Jeani Hawking, PA-C 09/13/23 1143

## 2023-09-13 NOTE — Discharge Instructions (Signed)
I am glad that you are feeling better after the medication.  Please start pantoprazole daily.  Take this 30 minutes before meal or 2 hours after.  Take sucralfate before each meal for the next week.  Eat a bland diet and avoid spicy/acidic/fatty foods.  Avoid alcohol as well as NSAIDs (aspirin, ibuprofen/Advil, naproxen/Aleve).  I would like you to follow-up with a gastroenterologist.  Call them to schedule an appointment.  If you have recurrent symptoms or if anything changes and you have severe pain, nausea/vomiting, blood in your stool, blood in your vomit, dark black stools, weakness you need to be seen immediately.

## 2023-09-13 NOTE — ED Triage Notes (Signed)
Pt reports ABD pain since yesterday with out N/V/D. Pt also has pain to the back of his neck .During the night Pt had pinching feeling on Lt arm. Arm is normal this morning.

## 2023-09-14 ENCOUNTER — Other Ambulatory Visit: Payer: Self-pay

## 2023-09-14 ENCOUNTER — Emergency Department (HOSPITAL_COMMUNITY)
Admission: EM | Admit: 2023-09-14 | Discharge: 2023-09-14 | Disposition: A | Discharge status: Home / Self Care | Payer: Self-pay | Attending: Emergency Medicine | Admitting: Emergency Medicine

## 2023-09-14 ENCOUNTER — Emergency Department (HOSPITAL_COMMUNITY): Payer: Self-pay

## 2023-09-14 ENCOUNTER — Encounter (HOSPITAL_COMMUNITY): Payer: Self-pay

## 2023-09-14 DIAGNOSIS — D72829 Elevated white blood cell count, unspecified: Secondary | ICD-10-CM | POA: Insufficient documentation

## 2023-09-14 DIAGNOSIS — K5790 Diverticulosis of intestine, part unspecified, without perforation or abscess without bleeding: Secondary | ICD-10-CM | POA: Insufficient documentation

## 2023-09-14 DIAGNOSIS — K5792 Diverticulitis of intestine, part unspecified, without perforation or abscess without bleeding: Secondary | ICD-10-CM

## 2023-09-14 LAB — CBC
HCT: 44.2 % (ref 39.0–52.0)
Hemoglobin: 15.5 g/dL (ref 13.0–17.0)
MCH: 30.3 pg (ref 26.0–34.0)
MCHC: 35.1 g/dL (ref 30.0–36.0)
MCV: 86.5 fL (ref 80.0–100.0)
Platelets: 202 10*3/uL (ref 150–400)
RBC: 5.11 MIL/uL (ref 4.22–5.81)
RDW: 11.3 % — ABNORMAL LOW (ref 11.5–15.5)
WBC: 16.3 10*3/uL — ABNORMAL HIGH (ref 4.0–10.5)
nRBC: 0 % (ref 0.0–0.2)

## 2023-09-14 LAB — COMPREHENSIVE METABOLIC PANEL
ALT: 28 U/L (ref 0–44)
AST: 23 U/L (ref 15–41)
Albumin: 4.2 g/dL (ref 3.5–5.0)
Alkaline Phosphatase: 62 U/L (ref 38–126)
Anion gap: 8 (ref 5–15)
BUN: 8 mg/dL (ref 6–20)
CO2: 24 mmol/L (ref 22–32)
Calcium: 9.1 mg/dL (ref 8.9–10.3)
Chloride: 104 mmol/L (ref 98–111)
Creatinine, Ser: 0.9 mg/dL (ref 0.61–1.24)
GFR, Estimated: >60 mL/min (ref >60)
Glucose, Bld: 109 mg/dL — ABNORMAL HIGH (ref 70–99)
Potassium: 3.8 mmol/L (ref 3.5–5.1)
Sodium: 136 mmol/L (ref 135–145)
Total Bilirubin: 0.7 mg/dL (ref 0.3–1.2)
Total Protein: 7.7 g/dL (ref 6.5–8.1)

## 2023-09-14 LAB — URINALYSIS, ROUTINE W REFLEX MICROSCOPIC
Bacteria, UA: NONE SEEN
Bilirubin Urine: NEGATIVE
Glucose, UA: NEGATIVE mg/dL
Ketones, ur: 5 mg/dL — AB
Leukocytes,Ua: NEGATIVE
Nitrite: NEGATIVE
Protein, ur: 30 mg/dL — AB
Specific Gravity, Urine: 1.031 — ABNORMAL HIGH (ref 1.005–1.030)
pH: 5 (ref 5.0–8.0)

## 2023-09-14 LAB — LIPASE, BLOOD: Lipase: 25 U/L (ref 11–51)

## 2023-09-14 MED ORDER — OXYCODONE HCL 5 MG PO TABS
5.0000 mg | ORAL_TABLET | Freq: Once | ORAL | Status: AC
  Administered 2023-09-14: 5 mg via ORAL
  Filled 2023-09-14: qty 1

## 2023-09-14 MED ORDER — IOHEXOL 350 MG/ML SOLN
75.0000 mL | Freq: Once | INTRAVENOUS | Status: AC | PRN
  Administered 2023-09-14: 75 mL via INTRAVENOUS

## 2023-09-14 MED ORDER — AMOXICILLIN-POT CLAVULANATE 875-125 MG PO TABS
1.0000 | ORAL_TABLET | Freq: Three times a day (TID) | ORAL | 0 refills | Status: AC
Start: 2023-09-14 — End: 2023-09-19

## 2023-09-14 MED ORDER — ONDANSETRON HCL 4 MG PO TABS
4.0000 mg | ORAL_TABLET | Freq: Three times a day (TID) | ORAL | 0 refills | Status: AC | PRN
Start: 2023-09-14 — End: 2023-09-16

## 2023-09-14 MED ORDER — ACETAMINOPHEN 500 MG PO TABS
1000.0000 mg | ORAL_TABLET | Freq: Once | ORAL | Status: AC
  Administered 2023-09-14: 1000 mg via ORAL
  Filled 2023-09-14: qty 2

## 2023-09-14 MED ORDER — AMOXICILLIN-POT CLAVULANATE 875-125 MG PO TABS
1.0000 | ORAL_TABLET | Freq: Once | ORAL | Status: AC
  Administered 2023-09-14: 1 via ORAL
  Filled 2023-09-14: qty 1

## 2023-09-14 MED ORDER — OXYCODONE-ACETAMINOPHEN 5-325 MG PO TABS
1.0000 | ORAL_TABLET | Freq: Three times a day (TID) | ORAL | 0 refills | Status: AC | PRN
Start: 2023-09-14 — End: 2023-09-16

## 2023-09-14 MED ORDER — SUCRALFATE 1 GM/10ML PO SUSP
1.0000 g | Freq: Once | ORAL | Status: AC
  Administered 2023-09-14: 1 g via ORAL
  Filled 2023-09-14: qty 10

## 2023-09-14 NOTE — ED Notes (Signed)
Patient transported to CT by wheelchair.

## 2023-09-14 NOTE — ED Triage Notes (Signed)
PT c/o generalized abdominal pain and [id number]. Pt c/o diarrhea started this morning. Pt denies N/V

## 2023-09-14 NOTE — Discharge Instructions (Addendum)
It was a pleasure caring for you today. CT was concerning for diverticulitis. I will send prescription to your pharmacy. You will need to follow up with GI within 6 weeks for a colonoscopy. Please also follow up with your primary care provider. Seek emergency care if experiencing any new or worsening symptoms.  I have sent your antibiotics to your pharmacy. I also sent you some percocet for breakthrough pain. Percocet can be taken if your abdominal pain is not relieved with other over-the-counter pain medication. I have also sent you medication (Zofran) for nausea if you need it.

## 2023-09-14 NOTE — ED Notes (Signed)
Patient tolerated water, and crackers without difficulty.

## 2023-09-14 NOTE — ED Provider Notes (Signed)
[place] [facility] Provider Note   CSN: [id number] Arrival date & time: 09/14/23  1036     History  Chief Complaint  Patient presents with   Abdominal Pain   Headache    Jerry Booker is a 42 y.o. male who presents to ED concerned for generalized abdominal pain greatest in RLQ x3 days. Patient went to UC yesterday, obtained a GI cocktail, symptoms resolved so he was discharged home. Patient stating that pain is now increasing in RLQ. Also endorsing non-bloody diarrhea that started today. Denies IBS, chrons disease, GERD.  Of note, patient also stating that he has had a frontal/tension headache since yesterday. Denies LOC, seizure, head trauma.   Denies chest pain, dyspnea, cough, nausea, vomiting, dysuria, hematuria, hematochezia.     Abdominal Pain Headache Associated symptoms: abdominal pain        Home Medications Prior to Admission medications   Medication Sig Start Date End Date Taking? Authorizing Provider  amoxicillin-clavulanate (AUGMENTIN) 875-125 MG tablet Take 1 tablet by mouth 3 (three) times daily for 5 days. 09/14/23 09/19/23 Yes Valrie Hart F, PA-C  ondansetron (ZOFRAN) 4 MG tablet Take 1 tablet (4 mg total) by mouth every 8 (eight) hours as needed for up to 2 days for nausea or vomiting. 09/14/23 09/16/23 Yes Dorthy Cooler, PA-C  oxyCODONE-acetaminophen (PERCOCET/ROXICET) 5-325 MG tablet Take 1 tablet by mouth every 8 (eight) hours as needed for up to 2 days for severe pain. 09/14/23 09/16/23 Yes Valrie Hart F, PA-C  acetaminophen (TYLENOL) 500 MG tablet Take 2 tablets (1,000 mg total) by mouth every 6 (six) hours as needed. 07/17/20   Darr, Gerilyn Pilgrim, PA-C  dicyclomine (BENTYL) 10 MG capsule Take 1 capsule (10 mg total) by mouth 4 (four) times daily -  before meals and at bedtime. Prn cramps 07/16/22   Rodriguez-Southworth, Nettie Elm, PA-C  ibuprofen (ADVIL) 200 MG tablet Take 200 mg by mouth every 6 (six)  hours as needed.    [provider]  pantoprazole (PROTONIX) 40 MG tablet Take 1 tablet (40 mg total) by mouth daily. 09/13/23   Raspet, Noberto Retort, PA-C  sucralfate (CARAFATE) 1 g tablet Take 1 tablet (1 g total) by mouth with breakfast, with lunch, and with evening meal. 09/13/23   Raspet, Erin K, PA-C  tamsulosin (FLOMAX) 0.4 MG CAPS capsule Take 1 capsule (0.4 mg total) by mouth daily. 05/11/22   Rising, Lurena Joiner, PA-C      Allergies    Patient has no known allergies.    Review of Systems   Review of Systems  Gastrointestinal:  Positive for abdominal pain.  Neurological:  Positive for headaches.    Physical Exam Updated Vital Signs BP 99/67   Pulse 85   Temp 98.6 F (37 C)   Resp 14   Ht 5\' 8"  (1.727 m)   Wt 74.3 kg   SpO2 96%   BMI 24.91 kg/m  Physical Exam Vitals and nursing note reviewed.  Constitutional:      General: He is not in acute distress.    Appearance: He is not ill-appearing or toxic-appearing.  HENT:     Head: Normocephalic and atraumatic.     Mouth/Throat:     Mouth: Mucous membranes are moist.     Pharynx: No posterior oropharyngeal erythema.  Eyes:     General: No scleral icterus.       Right eye: No discharge.        Left eye: No discharge.  Conjunctiva/sclera: Conjunctivae normal.  Cardiovascular:     Rate and Rhythm: Normal rate and regular rhythm.     Pulses: Normal pulses.     Heart sounds: Normal heart sounds. No murmur heard. Pulmonary:     Effort: Pulmonary effort is normal. No respiratory distress.     Breath sounds: Normal breath sounds. No wheezing, rhonchi or rales.  Abdominal:     General: Abdomen is flat. Bowel sounds are normal. There is no distension.     Palpations: Abdomen is soft. There is no mass.     Tenderness: There is generalized abdominal tenderness and tenderness in the right lower quadrant.  Musculoskeletal:     Right lower leg: No edema.     Left lower leg: No edema.  Skin:    General: Skin is warm and dry.      Findings: No rash.  Neurological:     General: No focal deficit present.     Mental Status: He is alert and oriented to person, place, and time. Mental status is at baseline.     Comments: GCS 15. Speech is goal oriented. No deficits appreciated to CN III-XII; symmetric eyebrow raise, no facial drooping, tongue midline. Patient has equal grip strength bilaterally with 5/5 strength against resistance in all major muscle groups bilaterally. Sensation to light touch intact. Patient moves extremities without ataxia. Patient ambulatory with steady gait.    Psychiatric:        Mood and Affect: Mood normal.        Behavior: Behavior normal.     ED Results / Procedures / Treatments   Labs (all labs ordered are listed, but only abnormal results are displayed) Labs Reviewed  COMPREHENSIVE METABOLIC PANEL - Abnormal; Notable for the following components:      Result Value   Glucose, Bld 109 (*)    All other components within normal limits  CBC - Abnormal; Notable for the following components:   WBC 16.3 (*)    RDW 11.3 (*)    All other components within normal limits  URINALYSIS, ROUTINE W REFLEX MICROSCOPIC - Abnormal; Notable for the following components:   Color, Urine AMBER (*)    APPearance HAZY (*)    Specific Gravity, Urine 1.031 (*)    Hgb urine dipstick SMALL (*)    Ketones, ur 5 (*)    Protein, ur 30 (*)    All other components within normal limits  LIPASE, BLOOD    EKG None  Radiology CT ABDOMEN PELVIS W CONTRAST  Result Date: 09/14/2023 CLINICAL DATA:  Abdominal pain, acute, nonlocalized EXAM: CT ABDOMEN AND PELVIS WITH CONTRAST TECHNIQUE: Multidetector CT imaging of the abdomen and pelvis was performed using the standard protocol following bolus administration of intravenous contrast. RADIATION DOSE REDUCTION: This exam was performed according to the departmental dose-optimization program which includes automated exposure control, adjustment of the mA and/or kV  according to patient size and/or use of iterative reconstruction technique. CONTRAST:  75mL OMNIPAQUE IOHEXOL 350 MG/ML SOLN COMPARISON:  April twentieth 2023 FINDINGS: Lower chest: No acute abnormality. Hepatobiliary: No focal liver abnormality is seen. No gallstones, gallbladder wall thickening, or biliary dilatation. Pancreas: Unremarkable. No pancreatic ductal dilatation or surrounding inflammatory changes. Spleen: Normal in size without focal abnormality. Adrenals/Urinary Tract: Adrenal glands are unremarkable. Kidneys are normal, without renal calculi, focal lesion, or hydronephrosis. Bladder is unremarkable. Stomach/Bowel: No evidence of bowel obstruction. There is circumferential wall thickening with adjacent fat stranding of the cecum. Appendix is normal. Mild circumferential wall prominence  of the terminal ileum. Fat stranding appears to be centered around an area of curvilinear high density which corresponded to a calcified diverticulum on prior CT. High density enteric debris within the stomach. Vascular/Lymphatic: Abdominal aorta is normal in course and caliber. Several mildly enlarged mesenteric lymph nodes of the RIGHT lower quadrant with representative lymph node measuring 8 mm in the short axis (series 3, image 42). Reproductive: Prostate is unremarkable. Other: Small fat containing LEFT inguinal hernia. No focal drainable fluid collection. No free air. Musculoskeletal: No acute or significant osseous findings. Assimilation joints at the lumbosacral junction. IMPRESSION: 1. Constellation of findings are favored to reflect acute cecal diverticulitis. Recommend correlation with colonoscopy after resolution of acute symptoms to exclude underlying mass. 2. Mildly enlarged mesenteric lymph nodes of the RIGHT lower quadrant are likely reactive. Electronically Signed   By: Meda Klinefelter M.D.   On: 09/14/2023 12:34    Procedures Procedures    Medications Ordered in ED Medications  sucralfate  (CARAFATE) 1 GM/10ML suspension 1 g (1 g Oral Given 09/14/23 1145)  iohexol (OMNIPAQUE) 350 MG/ML injection 75 mL (75 mLs Intravenous Contrast Given 09/14/23 1205)  acetaminophen (TYLENOL) tablet 1,000 mg (1,000 mg Oral Given 09/14/23 1345)  oxyCODONE (Oxy IR/ROXICODONE) immediate release tablet 5 mg (5 mg Oral Given 09/14/23 1418)  amoxicillin-clavulanate (AUGMENTIN) 875-125 MG per tablet 1 tablet (1 tablet Oral Given 09/14/23 1418)    ED Course/ Medical Decision Making/ A&P                                 Medical Decision Making Amount and/or Complexity of Data Reviewed Labs: ordered.    This patient presents to the ED for concern of abdominal pain, this involves an extensive number of treatment options, and is a complaint that carries with it a high risk of complications and morbidity.  The differential diagnosis includes gastroenteritis, colitis, small bowel obstruction, appendicitis, cholecystitis, pancreatitis, nephrolithiasis, UTI, pyleonephritis, testicular torsion.   Co morbidities that complicate the patient evaluation  none   Additional history obtained:  Patient presented to [facility] yesterday for abdominal pain.  Received GI cocktail and symptoms resolved.  Discharged.   Lab Tests:  I Ordered, and personally interpreted labs.  The pertinent results include: CBC with differential: leukocytosis at 16.3; no anemia CMP: no concern for electrolyte abnormality; no concern for kidney/liver damage Lipase: within normal limits UA: not concerning for infection    Imaging Studies ordered:  I ordered imaging studies including  -CT Abd/Pelvis with contrast: evaluate for structural/surgical etiology of patients' severe abdominal pain.  I independently visualized and interpreted imaging I agree with the radiologist interpretation    Problem List / ED Course / Critical interventions / Medication management  Patient presents to ED for abdominal pain.  Overall, patient is well  appearing and not in acute distress. Patient also stating that he had nonbloody diarrhea this morning.  Denying any other infectious symptoms today.  Denies past history of IBS or chron's.  Physical exam with tenderness to palpation of RLQ.  Patient afebrile with stable vitals. CBC with leukocytosis at 16.3.  No anemia.  CMP reassuring.  UA without concern for infection.  Lipase within normal limits. CT showing diverticulitis, but mass cannot be excluded.  Educated patient that we will be sending him home with pain management and antibiotics and that he will need follow-up with GI.  Educated patient that a mass in his intestines cannot be  ruled out this time, which would require a colonoscopy.  Patient verbalized understanding of plan.  Provided patient with GI information and discharge paperwork. Patient tolerating p.o. intake.  Sent antibiotics to pharmacy.  Provided patient with a dose of antibiotics here in ED which he tolerated well. Will provide patient percocet for breakthrough pain. I have reviewed the patients home medicines and have made adjustments as needed Patient was given return precautions. Patient stable for discharge at this time.  Patient verbalized understanding of plan.  Ddx: These are considered less likely due to history of present illness and physical exam. -gastroenteritis: No vomiting in ED; no fever; tolerating PO intake -small bowel obstruction: Last BM today  -appendicitis: CT without concern -cholecystitis: CT without concern; liver enzymes within normal limits  -pancreatitis: No LUQ tenderness to palpation, lipase within normal limits  -nephrolithiasis: Denies flank pain and urinary complaints  -UTI/pyelonephritis: Denies urinary complaints  -testicular torsion: pain does not radiate into testicles   Social Determinants of Health:  none   This note has been dictated using Dragon Engineer, civil (consulting). Unfortunately, this method of dictation can sometimes  lead to typographical or grammatical errors. I apologize for your inconvenience in advance if this occurs. Please do not hesitate to reach out to me if clarification is needed.            Final Clinical Impression(s) / ED Diagnoses Final diagnoses:  Diverticulitis    Rx / DC Orders ED Discharge Orders          Ordered    amoxicillin-clavulanate (AUGMENTIN) 875-125 MG tablet  3 times daily        09/14/23 1354    oxyCODONE-acetaminophen (PERCOCET/ROXICET) 5-325 MG tablet  Every 8 hours PRN        09/14/23 1356    ondansetron (ZOFRAN) 4 MG tablet  Every 8 hours PRN        09/14/23 1421              Dorthy Cooler, [place] 09/14/23 1423    Laurence Spates, MD 09/14/23 2022

## 2023-12-10 ENCOUNTER — Ambulatory Visit: Payer: Self-pay | Admitting: Gastroenterology

## 2024-05-17 ENCOUNTER — Encounter (HOSPITAL_COMMUNITY): Payer: Self-pay

## 2024-05-17 ENCOUNTER — Ambulatory Visit (HOSPITAL_COMMUNITY)
Admission: EM | Admit: 2024-05-17 | Discharge: 2024-05-17 | Disposition: A | Discharge status: Home / Self Care | Payer: Self-pay | Attending: Family Medicine | Admitting: Family Medicine

## 2024-05-17 DIAGNOSIS — R109 Unspecified abdominal pain: Secondary | ICD-10-CM

## 2024-05-17 LAB — POCT URINALYSIS DIP (MANUAL ENTRY)
Bilirubin, UA: NEGATIVE
Blood, UA: NEGATIVE
Glucose, UA: NEGATIVE mg/dL
Ketones, POC UA: NEGATIVE mg/dL
Leukocytes, UA: NEGATIVE
Nitrite, UA: NEGATIVE
Protein Ur, POC: NEGATIVE mg/dL
Spec Grav, UA: 1.015 (ref 1.010–1.025)
Urobilinogen, UA: 0.2 U/dL
pH, UA: 7.5 (ref 5.0–8.0)

## 2024-05-17 MED ORDER — LEVOFLOXACIN 500 MG PO TABS: 500.0000 mg | ORAL_TABLET | Freq: Every day | ORAL | 0 refills | Status: AC

## 2024-05-17 NOTE — ED Provider Notes (Signed)
 [facility]    CSN: [id number] Arrival date & time: 05/17/24  1753      History   Chief Complaint Chief Complaint  Patient presents with   Abdominal Pain    HPI Jerry Booker is a 43 y.o. male.    Abdominal Pain Here for right sided abdominal pain that began over a month ago.  It is a constant low-grade pain with sometimes a worsening.  No associated nausea or vomiting or diarrhea.  BMs are normal and there is no blood.  No fever  He also has not had any dysuria or hematuria.  In September 2024 he was seen in the emergency room.  This was for right lower quadrant pain and CT at that time showed evidence of diverticulitis.  There were no gallstones on that study.  Also there were no renal stones noted on that study.  He states he did improve with the treatment provided for diverticulitis, but he doubts that it was diverticulitis as later a scope did not show any diverticula when he had that done with GI.  He has had renal stones in the past on CT.  The last time was about 5 years ago.  No exacerbating factors like eating or being empty, but his pain may be worse when he lies down  NKDA  Past Medical History:  Diagnosis Date   Chest pain    Renal stone     Patient Active Problem List   Diagnosis Date Noted   Nephrolithiasis 07/19/2020    History reviewed. No pertinent surgical history.     Home Medications    Prior to Admission medications   Medication Sig Start Date End Date Taking? Authorizing Provider  levofloxacin (LEVAQUIN) 500 MG tablet Take 1 tablet (500 mg total) by mouth daily for 7 days. 05/17/24 05/24/24 Yes Ann Keto, MD  acetaminophen  (TYLENOL ) 500 MG tablet Take 2 tablets (1,000 mg total) by mouth every 6 (six) hours as needed. 07/17/20   Darr, Derwood Flor, PA-C    Family History Family History  Problem Relation Age of Onset   Mental illness Mother     Social History Social History   Tobacco Use   Smoking status: Never    Smokeless tobacco: Never  Vaping Use   Vaping status: Never Used  Substance Use Topics   Alcohol use: Yes    Comment: beer once in a while   Drug use: No     Allergies   Patient has no known allergies.   Review of Systems Review of Systems  Gastrointestinal:  Positive for abdominal pain.     Physical Exam Triage Vital Signs ED Triage Vitals  Encounter Vitals Group     BP 05/17/24 1807 117/72     Systolic BP Percentile --      Diastolic BP Percentile --      Pulse Rate 05/17/24 1805 68     Resp 05/17/24 1805 16     Temp 05/17/24 1805 98.1 F (36.7 C)     Temp Source 05/17/24 1805 Oral     SpO2 05/17/24 1805 98 %     Weight --      Height --      Head Circumference --      Peak Flow --      Pain Score 05/17/24 1807 4     Pain Loc --      Pain Education --      Exclude from Growth Chart --  No data found.  Updated Vital Signs BP 117/72   Pulse 68   Temp 98.1 F (36.7 C) (Oral)   Resp 16   SpO2 98%   Visual Acuity Right Eye Distance:   Left Eye Distance:   Bilateral Distance:    Right Eye Near:   Left Eye Near:    Bilateral Near:     Physical Exam Vitals reviewed.  Constitutional:      General: He is not in acute distress.    Appearance: He is not ill-appearing, toxic-appearing or diaphoretic.  HENT:     Mouth/Throat:     Mouth: Mucous membranes are moist.  Eyes:     Extraocular Movements: Extraocular movements intact.     Pupils: Pupils are equal, round, and reactive to light.  Cardiovascular:     Rate and Rhythm: Normal rate and regular rhythm.     Heart sounds: No murmur heard. Pulmonary:     Effort: Pulmonary effort is normal.     Breath sounds: Normal breath sounds.  Abdominal:     General: There is no distension.     Palpations: Abdomen is soft. There is no mass.     Tenderness: There is no guarding.     Comments: There is some possible mild tenderness in his right upper quadrant near the umbilicus.  He points out a nodule.  I  do feel possibly a subcu nodule in his right upper quadrant also.  Musculoskeletal:     Cervical back: Neck supple.  Lymphadenopathy:     Cervical: No cervical adenopathy.  Skin:    Coloration: Skin is not jaundiced or pale.  Neurological:     General: No focal deficit present.     Mental Status: He is alert.  Psychiatric:        Behavior: Behavior normal.      UC Treatments / Results  Labs (all labs ordered are listed, but only abnormal results are displayed) Labs Reviewed  POCT URINALYSIS DIP (MANUAL ENTRY)    EKG   Radiology No results found.  Procedures Procedures (including critical care time)  Medications Ordered in UC Medications - No data to display  Initial Impression / Assessment and Plan / UC Course  I have reviewed the triage vital signs and the nursing notes.  Pertinent labs & imaging results that were available during my care of the patient were reviewed by me and considered in my medical decision making (see chart for details).     Urinalysis is negative for any abnormality.  Levaquin is sent in for possible diverticulitis.  I have asked him to follow-up with his gastroenterologist, and he is given instructions on how to set up a primary care appointment.  He wanted me to order an ultrasound or CT from our clinic.  I discussed with him that we do not order advanced imaging from urgent care, as we do not have the staff to schedule or get advanced imaging approved.  He stated that he thought he had gotten an ultrasound here done before --I have discussed with him that we do not have ultrasound or CT in this urgent care clinic building.  Possibly he had gotten a plain x-ray prior to this, but that would not be an helpful evaluation for his current symptoms. Final Clinical Impressions(s) / UC Diagnoses   Final diagnoses:  Right sided abdominal pain     Discharge Instructions      The urinalysis was clear, without any red blood cells or white blood  cells.  Of note, when you did have a kidney stone on the CT, you had lots of red blood cells in your urine.  Levaquin 500 mg--take 1 tablet daily for 7 days; this is a treatment for potential diverticulitis.  Please note, since you improved with treatment for diverticulitis last year when you had similar symptoms, I think this is worthwhile to try.  If you worsen in any way, please go to the emergency room  Please follow-up with your gastroenterologist you are already established with.  You can use the QR code/website at the back of the summary paperwork to schedule yourself a new patient appointment with primary care     ED Prescriptions     Medication Sig Dispense Auth. Provider   levofloxacin (LEVAQUIN) 500 MG tablet Take 1 tablet (500 mg total) by mouth daily for 7 days. 7 tablet Kennede Lusk K, MD      PDMP not reviewed this encounter.   Ann Keto, MD 05/17/24 Rainey Burden

## 2024-05-17 NOTE — Discharge Instructions (Addendum)
 The urinalysis was clear, without any red blood cells or white blood cells.  Of note, when you did have a kidney stone on the CT, you had lots of red blood cells in your urine.  Levaquin 500 mg--take 1 tablet daily for 7 days; this is a treatment for potential diverticulitis.  Please note, since you improved with treatment for diverticulitis last year when you had similar symptoms, I think this is worthwhile to try.  If you worsen in any way, please go to the emergency room  Please follow-up with your gastroenterologist you are already established with.  You can use the QR code/website at the back of the summary paperwork to schedule yourself a new patient appointment with primary care

## 2024-05-17 NOTE — ED Triage Notes (Signed)
 Pt states RLQ abdominal pain for the past month. Denies N/V/D.

## 2025-01-24 ENCOUNTER — Ambulatory Visit (HOSPITAL_COMMUNITY)
Admission: EM | Admit: 2025-01-24 | Discharge: 2025-01-24 | Discharge status: Against Medical Advice | Payer: Self-pay | Source: Home / Self Care

## 2025-01-24 NOTE — ED Notes (Signed)
 Went to lobby to call patient for triage. Notified by front desk that pt has left facility.
